# Patient Record
Sex: Female | Born: 1985 | Race: White | Hispanic: Yes | Marital: Single | Smoking: Never smoker
Health system: Southern US, Community
[De-identification: ages and names within clinical notes are randomized; demographics above are authoritative.]

## PROBLEM LIST (undated history)

## (undated) ENCOUNTER — Emergency Department (HOSPITAL_COMMUNITY): Admission: EM | Payer: Medicaid Other | Source: Home / Self Care

## (undated) ENCOUNTER — Inpatient Hospital Stay (HOSPITAL_COMMUNITY): Payer: Self-pay

---

## 2006-02-27 ENCOUNTER — Ambulatory Visit: Payer: Self-pay | Admitting: Internal Medicine

## 2006-03-03 ENCOUNTER — Ambulatory Visit: Payer: Self-pay | Admitting: Family Medicine

## 2006-06-25 ENCOUNTER — Inpatient Hospital Stay (HOSPITAL_COMMUNITY): Admission: AD | Admit: 2006-06-25 | Discharge: 2006-06-25 | Payer: Self-pay | Admitting: Family Medicine

## 2006-06-27 ENCOUNTER — Inpatient Hospital Stay (HOSPITAL_COMMUNITY): Admission: AD | Admit: 2006-06-27 | Discharge: 2006-06-27 | Payer: Self-pay | Admitting: Obstetrics and Gynecology

## 2006-07-04 ENCOUNTER — Inpatient Hospital Stay (HOSPITAL_COMMUNITY): Admission: AD | Admit: 2006-07-04 | Discharge: 2006-07-04 | Payer: Self-pay | Admitting: Family Medicine

## 2007-04-15 ENCOUNTER — Ambulatory Visit: Payer: Self-pay | Admitting: Family Medicine

## 2007-04-15 ENCOUNTER — Encounter: Payer: Self-pay | Admitting: Family Medicine

## 2007-04-15 LAB — CONVERTED CEMR LAB
Basophils Absolute: 0 10*3/uL (ref 0.0–0.1)
Eosinophils Absolute: 0 10*3/uL (ref 0.0–0.7)
Eosinophils Relative: 0 % (ref 0–5)
HCT: 38.7 % (ref 36.0–46.0)
Hepatitis B Surface Ag: NEGATIVE
Lymphs Abs: 1.5 10*3/uL (ref 0.7–3.3)
MCV: 91.1 fL (ref 78.0–100.0)
Monocytes Absolute: 0.6 10*3/uL (ref 0.2–0.7)
Platelets: 225 10*3/uL (ref 150–400)
RDW: 13.3 % (ref 11.5–14.0)
Rh Type: POSITIVE

## 2007-04-22 ENCOUNTER — Encounter: Payer: Self-pay | Admitting: Family Medicine

## 2007-04-22 ENCOUNTER — Ambulatory Visit: Payer: Self-pay | Admitting: Family Medicine

## 2007-04-22 LAB — CONVERTED CEMR LAB
Glucose, Urine, Semiquant: NEGATIVE
Protein, U semiquant: NEGATIVE

## 2007-04-23 ENCOUNTER — Encounter: Payer: Self-pay | Admitting: Family Medicine

## 2007-04-23 LAB — CONVERTED CEMR LAB: Chlamydia, DNA Probe: NEGATIVE

## 2007-04-28 ENCOUNTER — Encounter: Payer: Self-pay | Admitting: Family Medicine

## 2007-05-03 ENCOUNTER — Telehealth: Payer: Self-pay | Admitting: Family Medicine

## 2007-05-04 ENCOUNTER — Ambulatory Visit (HOSPITAL_COMMUNITY): Admission: RE | Admit: 2007-05-04 | Discharge: 2007-05-04 | Payer: Self-pay | Admitting: Sports Medicine

## 2007-05-05 ENCOUNTER — Encounter: Payer: Self-pay | Admitting: Family Medicine

## 2007-05-14 ENCOUNTER — Encounter: Payer: Self-pay | Admitting: Family Medicine

## 2007-05-14 ENCOUNTER — Encounter (INDEPENDENT_AMBULATORY_CARE_PROVIDER_SITE_OTHER): Payer: Self-pay | Admitting: Family Medicine

## 2007-05-14 ENCOUNTER — Ambulatory Visit: Payer: Self-pay | Admitting: Family Medicine

## 2007-05-14 ENCOUNTER — Telehealth: Payer: Self-pay | Admitting: *Deleted

## 2007-05-14 ENCOUNTER — Ambulatory Visit (HOSPITAL_COMMUNITY): Admission: RE | Admit: 2007-05-14 | Discharge: 2007-05-14 | Payer: Self-pay | Admitting: Family Medicine

## 2007-05-14 DIAGNOSIS — O4690 Antepartum hemorrhage, unspecified, unspecified trimester: Secondary | ICD-10-CM | POA: Insufficient documentation

## 2007-05-14 DIAGNOSIS — R319 Hematuria, unspecified: Secondary | ICD-10-CM | POA: Insufficient documentation

## 2007-05-14 LAB — CONVERTED CEMR LAB
Blood in Urine, dipstick: NEGATIVE
Ketones, urine, test strip: NEGATIVE
Nitrite: NEGATIVE
Protein, U semiquant: NEGATIVE
Urobilinogen, UA: 0.2
pH: 7

## 2007-05-18 ENCOUNTER — Ambulatory Visit: Payer: Self-pay | Admitting: Family Medicine

## 2007-05-18 ENCOUNTER — Encounter: Payer: Self-pay | Admitting: Family Medicine

## 2007-05-18 DIAGNOSIS — R1013 Epigastric pain: Secondary | ICD-10-CM

## 2007-05-18 LAB — CONVERTED CEMR LAB: Glucose, Urine, Semiquant: NEGATIVE

## 2007-05-27 ENCOUNTER — Encounter: Payer: Self-pay | Admitting: Family Medicine

## 2007-06-03 ENCOUNTER — Emergency Department (HOSPITAL_COMMUNITY): Admission: EM | Admit: 2007-06-03 | Discharge: 2007-06-04 | Payer: Self-pay | Admitting: Emergency Medicine

## 2007-06-04 ENCOUNTER — Telehealth: Payer: Self-pay | Admitting: *Deleted

## 2007-06-07 ENCOUNTER — Ambulatory Visit: Payer: Self-pay | Admitting: Family Medicine

## 2007-06-07 ENCOUNTER — Encounter (INDEPENDENT_AMBULATORY_CARE_PROVIDER_SITE_OTHER): Payer: Self-pay | Admitting: Family Medicine

## 2007-06-07 DIAGNOSIS — R300 Dysuria: Secondary | ICD-10-CM

## 2007-06-07 LAB — CONVERTED CEMR LAB
Albumin: 3.9 g/dL (ref 3.5–5.2)
BUN: 8 mg/dL (ref 6–23)
CO2: 23 meq/L (ref 19–32)
Calcium: 9.5 mg/dL (ref 8.4–10.5)
Chloride: 103 meq/L (ref 96–112)
Creatinine, Ser: 0.46 mg/dL (ref 0.40–1.20)
Glucose, Bld: 82 mg/dL (ref 70–99)
HCT: 35.7 % — ABNORMAL LOW (ref 36.0–46.0)
Hemoglobin: 11.7 g/dL — ABNORMAL LOW (ref 12.0–15.0)
Potassium: 4.9 meq/L (ref 3.5–5.3)
RBC: 3.93 M/uL (ref 3.87–5.11)
WBC: 8.4 10*3/uL (ref 4.0–10.5)

## 2007-06-15 ENCOUNTER — Encounter: Payer: Self-pay | Admitting: Family Medicine

## 2007-06-15 ENCOUNTER — Ambulatory Visit: Payer: Self-pay | Admitting: Family Medicine

## 2007-06-15 ENCOUNTER — Encounter: Payer: Self-pay | Admitting: *Deleted

## 2007-06-15 DIAGNOSIS — K8020 Calculus of gallbladder without cholecystitis without obstruction: Secondary | ICD-10-CM | POA: Insufficient documentation

## 2007-06-15 LAB — CONVERTED CEMR LAB: Protein, U semiquant: NEGATIVE

## 2007-06-17 ENCOUNTER — Telehealth: Payer: Self-pay | Admitting: *Deleted

## 2007-06-18 ENCOUNTER — Encounter (INDEPENDENT_AMBULATORY_CARE_PROVIDER_SITE_OTHER): Payer: Self-pay | Admitting: Family Medicine

## 2007-06-18 ENCOUNTER — Ambulatory Visit (HOSPITAL_COMMUNITY): Admission: RE | Admit: 2007-06-18 | Discharge: 2007-06-18 | Payer: Self-pay | Admitting: Sports Medicine

## 2007-07-05 ENCOUNTER — Encounter: Payer: Self-pay | Admitting: Family Medicine

## 2007-07-13 ENCOUNTER — Encounter: Payer: Self-pay | Admitting: Family Medicine

## 2007-07-14 ENCOUNTER — Ambulatory Visit: Payer: Self-pay | Admitting: Family Medicine

## 2007-07-14 LAB — CONVERTED CEMR LAB
Glucose, Urine, Semiquant: NEGATIVE
Protein, U semiquant: NEGATIVE

## 2007-08-18 ENCOUNTER — Encounter: Payer: Self-pay | Admitting: Family Medicine

## 2007-08-18 ENCOUNTER — Ambulatory Visit: Payer: Self-pay | Admitting: Family Medicine

## 2007-08-18 LAB — CONVERTED CEMR LAB: GTT, 1 hr: 133 mg/dL

## 2007-08-25 ENCOUNTER — Ambulatory Visit: Payer: Self-pay | Admitting: Family Medicine

## 2007-08-25 DIAGNOSIS — K5900 Constipation, unspecified: Secondary | ICD-10-CM | POA: Insufficient documentation

## 2007-09-14 ENCOUNTER — Encounter: Payer: Self-pay | Admitting: Family Medicine

## 2007-09-14 ENCOUNTER — Ambulatory Visit: Payer: Self-pay | Admitting: Family Medicine

## 2007-09-14 LAB — CONVERTED CEMR LAB

## 2007-10-01 ENCOUNTER — Ambulatory Visit: Payer: Self-pay | Admitting: Family Medicine

## 2007-10-14 ENCOUNTER — Ambulatory Visit: Payer: Self-pay | Admitting: Family Medicine

## 2007-10-14 ENCOUNTER — Encounter: Payer: Self-pay | Admitting: Family Medicine

## 2007-10-14 LAB — CONVERTED CEMR LAB
Chlamydia, DNA Probe: NEGATIVE
GC Probe Amp, Genital: NEGATIVE
Glucose, Urine, Semiquant: NEGATIVE
Protein, U semiquant: NEGATIVE

## 2007-10-25 ENCOUNTER — Encounter: Payer: Self-pay | Admitting: Family Medicine

## 2007-10-25 ENCOUNTER — Ambulatory Visit: Payer: Self-pay | Admitting: Family Medicine

## 2007-11-04 ENCOUNTER — Encounter: Payer: Self-pay | Admitting: Family Medicine

## 2007-11-04 ENCOUNTER — Ambulatory Visit: Payer: Self-pay | Admitting: Family Medicine

## 2007-11-04 LAB — CONVERTED CEMR LAB
Bilirubin Urine: NEGATIVE
Ketones, urine, test strip: NEGATIVE
Specific Gravity, Urine: 1.02
pH: 6.5

## 2007-11-05 ENCOUNTER — Inpatient Hospital Stay (HOSPITAL_COMMUNITY): Admission: AD | Admit: 2007-11-05 | Discharge: 2007-11-07 | Payer: Self-pay | Admitting: Gynecology

## 2007-11-05 ENCOUNTER — Ambulatory Visit: Payer: Self-pay | Admitting: Advanced Practice Midwife

## 2007-11-05 ENCOUNTER — Telehealth: Payer: Self-pay | Admitting: *Deleted

## 2007-11-09 ENCOUNTER — Encounter (INDEPENDENT_AMBULATORY_CARE_PROVIDER_SITE_OTHER): Payer: Self-pay | Admitting: *Deleted

## 2007-11-15 ENCOUNTER — Telehealth (INDEPENDENT_AMBULATORY_CARE_PROVIDER_SITE_OTHER): Payer: Self-pay | Admitting: *Deleted

## 2007-11-15 ENCOUNTER — Ambulatory Visit: Payer: Self-pay | Admitting: Sports Medicine

## 2007-11-15 DIAGNOSIS — IMO0002 Reserved for concepts with insufficient information to code with codable children: Secondary | ICD-10-CM

## 2007-11-15 LAB — CONVERTED CEMR LAB
Bilirubin Urine: NEGATIVE
Casts: 0 /lpf
Ketones, urine, test strip: NEGATIVE
Nitrite: NEGATIVE
Urobilinogen, UA: 0.2
WBC, UA: >20 cells/hpf

## 2007-12-09 ENCOUNTER — Ambulatory Visit: Payer: Self-pay | Admitting: Family Medicine

## 2007-12-09 DIAGNOSIS — N898 Other specified noninflammatory disorders of vagina: Secondary | ICD-10-CM | POA: Insufficient documentation

## 2007-12-18 ENCOUNTER — Emergency Department (HOSPITAL_COMMUNITY): Admission: EM | Admit: 2007-12-18 | Discharge: 2007-12-18 | Payer: Self-pay | Admitting: Emergency Medicine

## 2008-01-07 ENCOUNTER — Ambulatory Visit: Payer: Self-pay | Admitting: Family Medicine

## 2008-01-13 IMAGING — US US OB LIMITED
1 series · 14 of 17 positions shown · non-contrast
Comparison: none

OBSTETRICAL ULTRASOUND:

 This ultrasound exam was performed in the [HOSPITAL] Ultrasound Department.  The OB US report was generated in the AS system, and faxed to the ordering physician.  This report is also available in [REDACTED] PACS.

[Series 1: us ob limited · 14 of 17 slices shown]
[im 1/17]
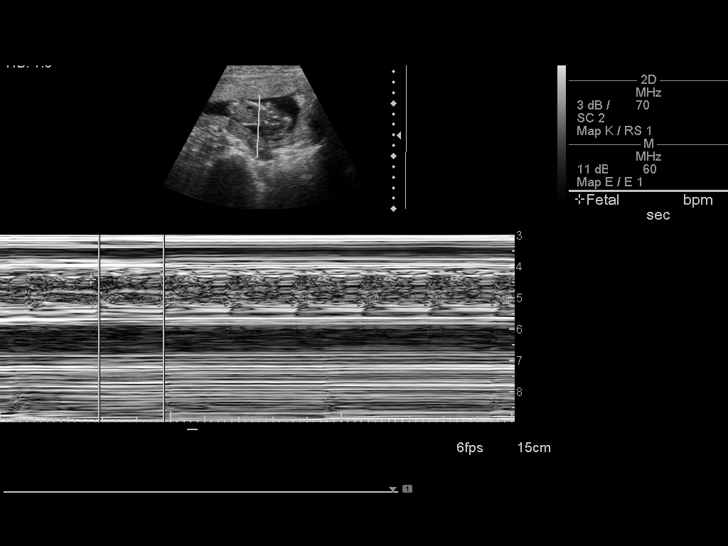
[im 2/17]
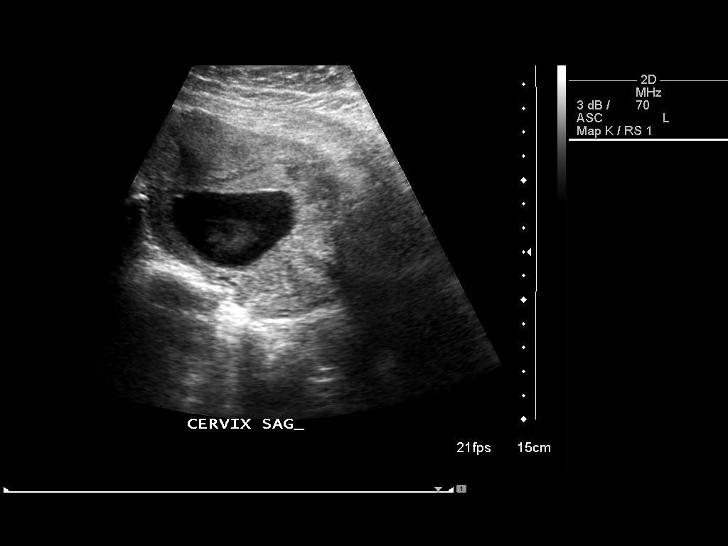
[im 4/17]
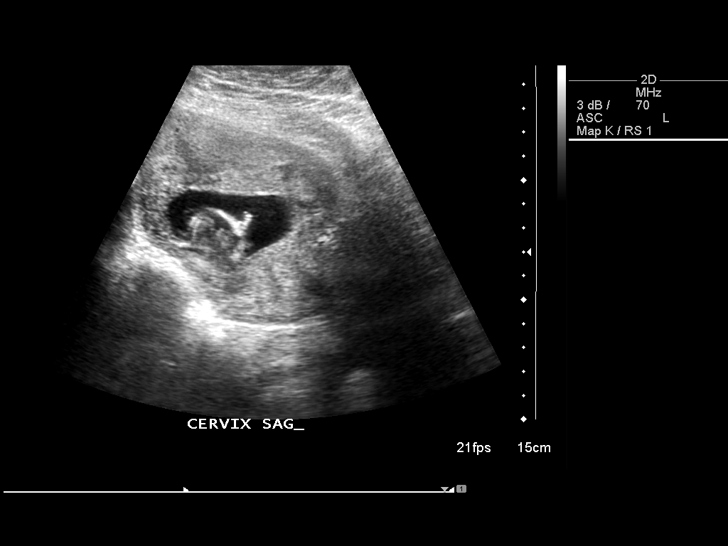
[im 5/17]
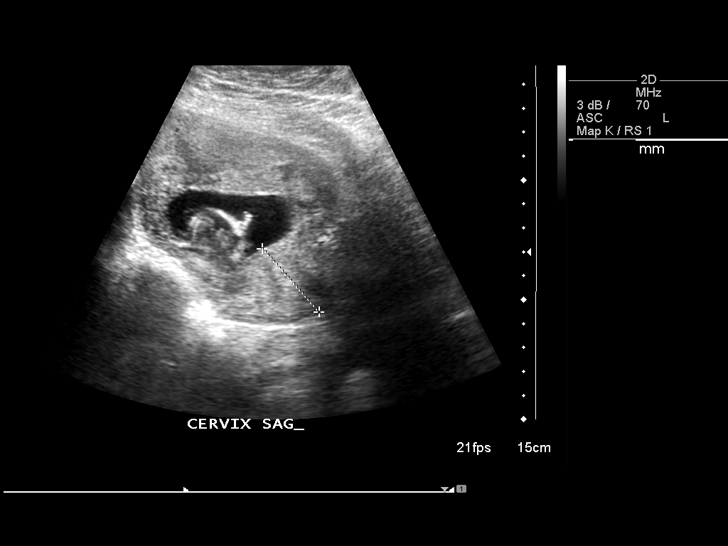
[im 6/17]
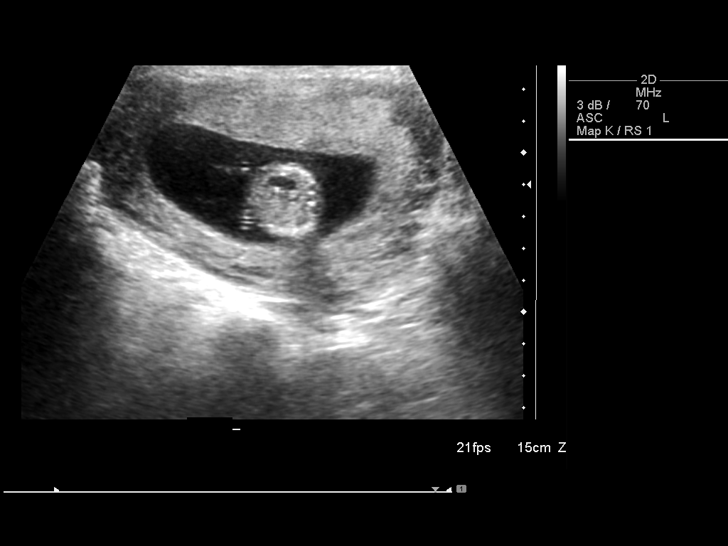
[im 7/17]
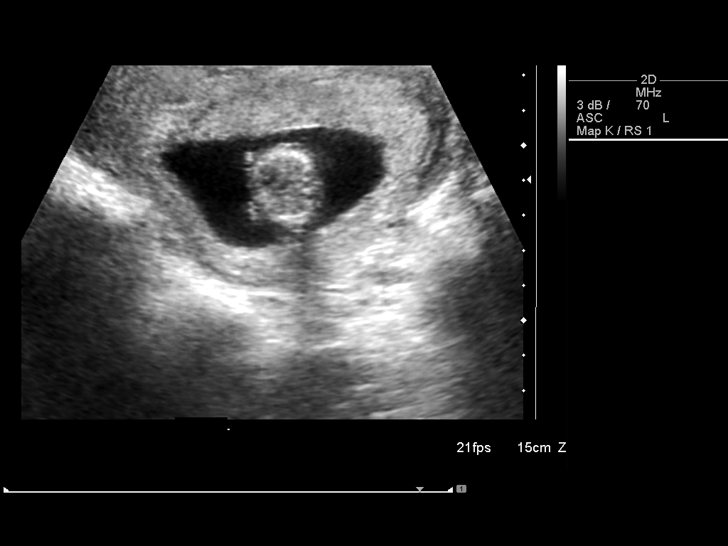
[im 8/17]
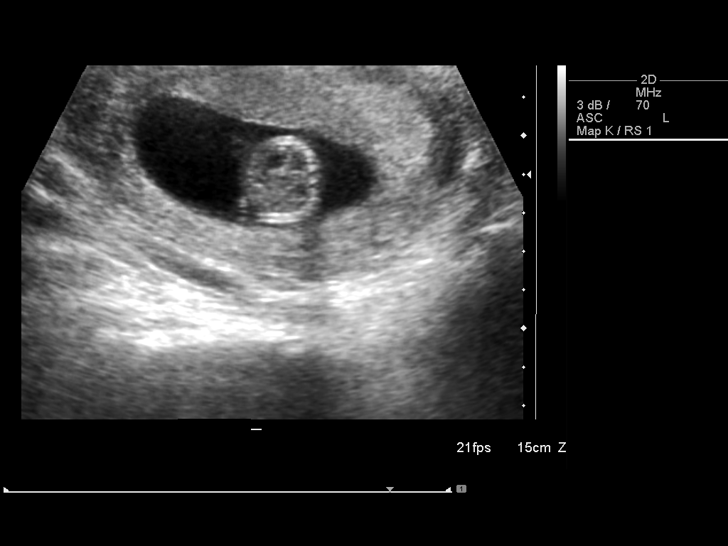
[im 10/17]
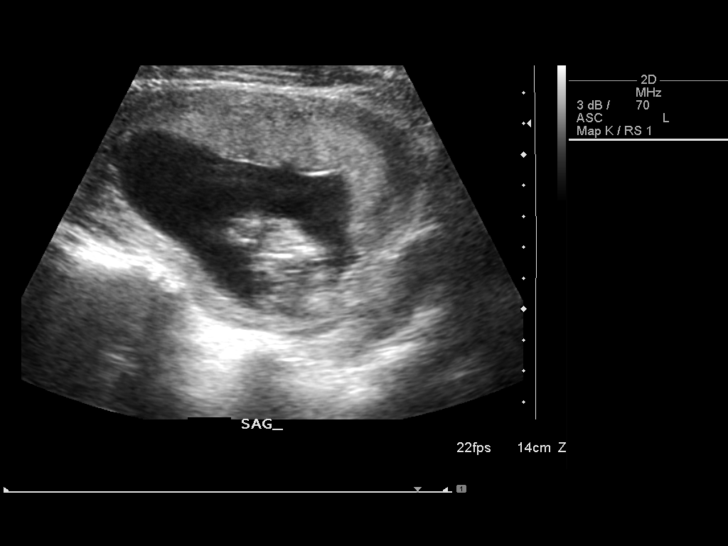
[im 11/17]
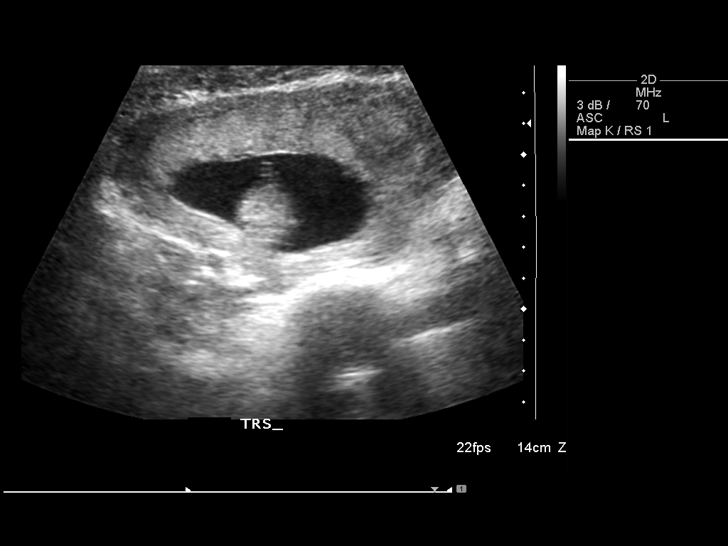
[im 12/17]
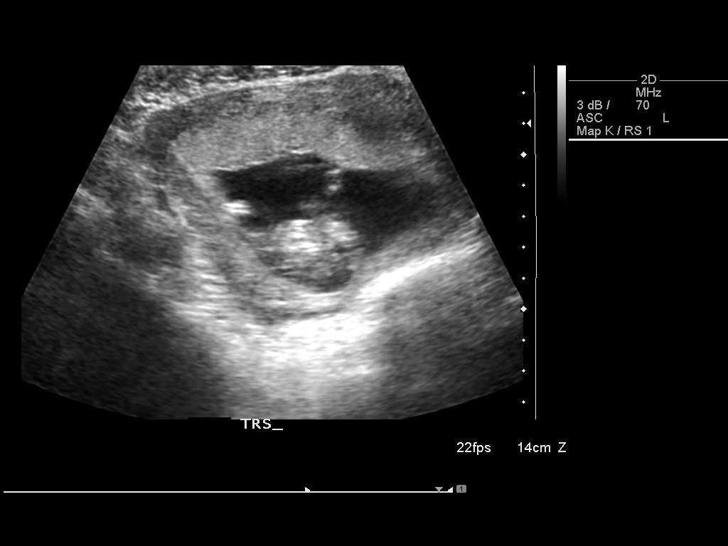
[im 13/17]
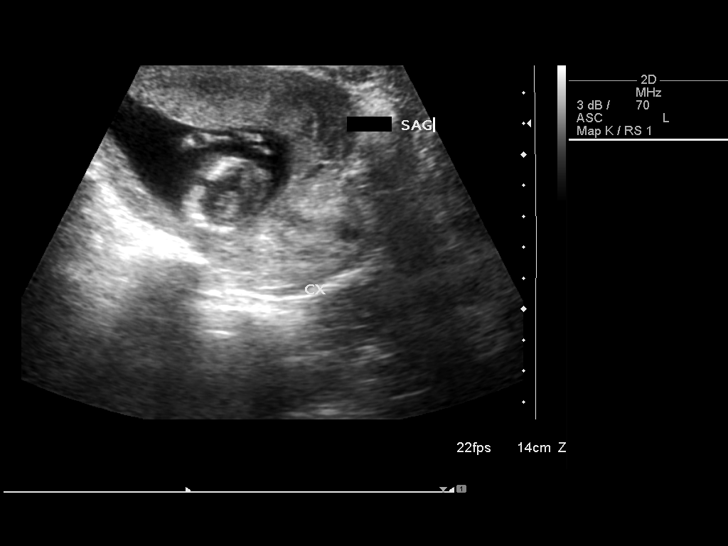
[im 14/17]
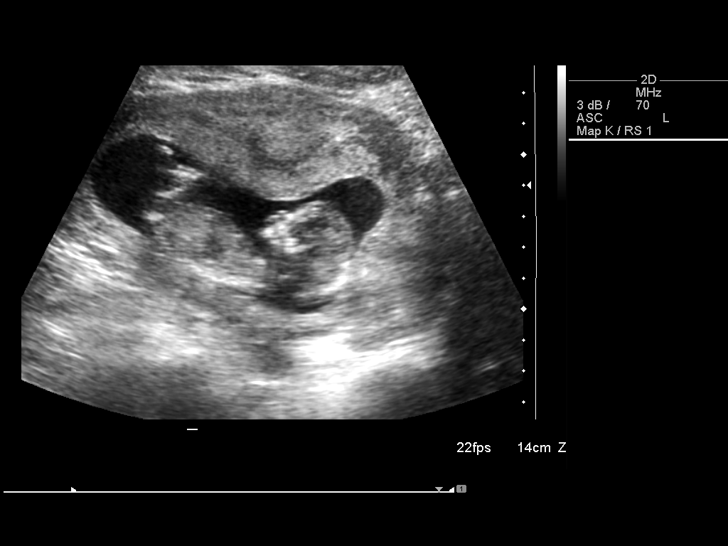
[im 16/17]
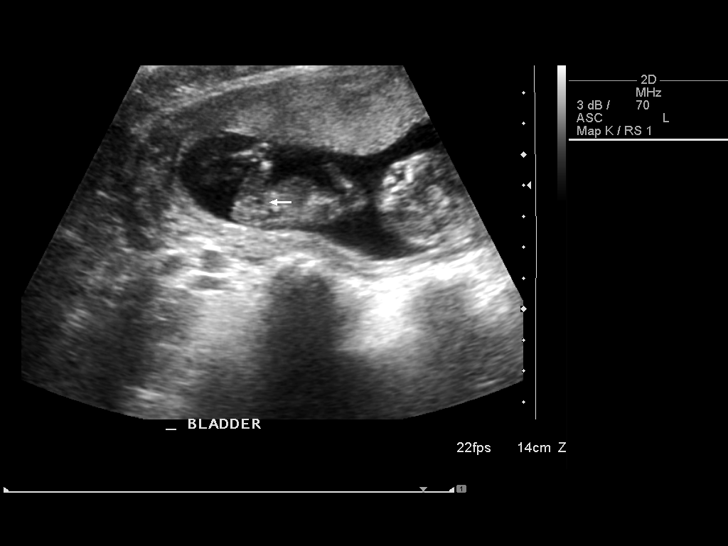
[im 17/17]
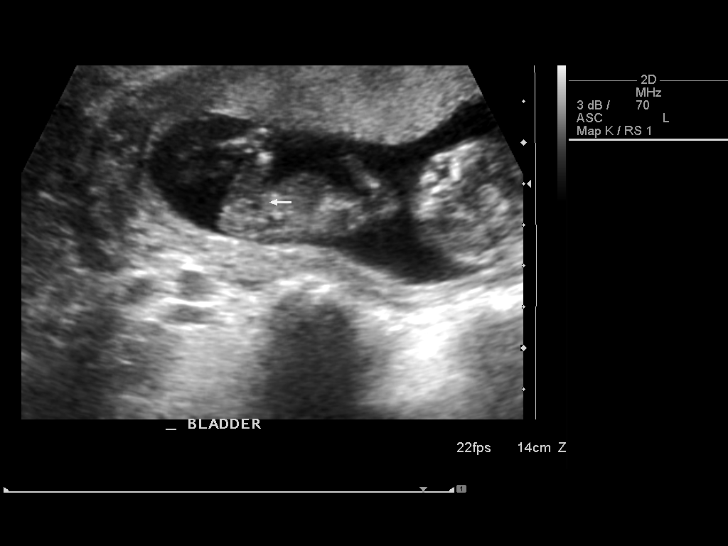

[14 of 17 positions shown; findings below may reference images not displayed]

IMPRESSION: See AS Obstetric US report.

## 2008-01-31 ENCOUNTER — Ambulatory Visit (HOSPITAL_COMMUNITY): Admission: RE | Admit: 2008-01-31 | Discharge: 2008-01-31 | Payer: Self-pay | Admitting: General Surgery

## 2008-01-31 ENCOUNTER — Telehealth (INDEPENDENT_AMBULATORY_CARE_PROVIDER_SITE_OTHER): Payer: Self-pay | Admitting: *Deleted

## 2008-01-31 ENCOUNTER — Encounter (INDEPENDENT_AMBULATORY_CARE_PROVIDER_SITE_OTHER): Payer: Self-pay | Admitting: General Surgery

## 2008-01-31 HISTORY — PX: CHOLECYSTECTOMY, LAPAROSCOPIC: SHX56

## 2008-02-02 IMAGING — US US ABDOMEN COMPLETE
1 series · 14 of 25 positions shown · non-contrast
Comparison: none

CLINICAL DATA: Pregnant patient with abdominal pain. Assess for gallbladder disease.
 ABDOMEN ULTRASOUND:
TECHNIQUE: Complete abdominal ultrasound examination was performed including evaluation of the liver, gallbladder, bile ducts, pancreas, kidneys, spleen, IVC, and abdominal aorta.

[Series 1: unknown · 0.30mm/px · 14 of 76 slices shown]
[im 1/76]
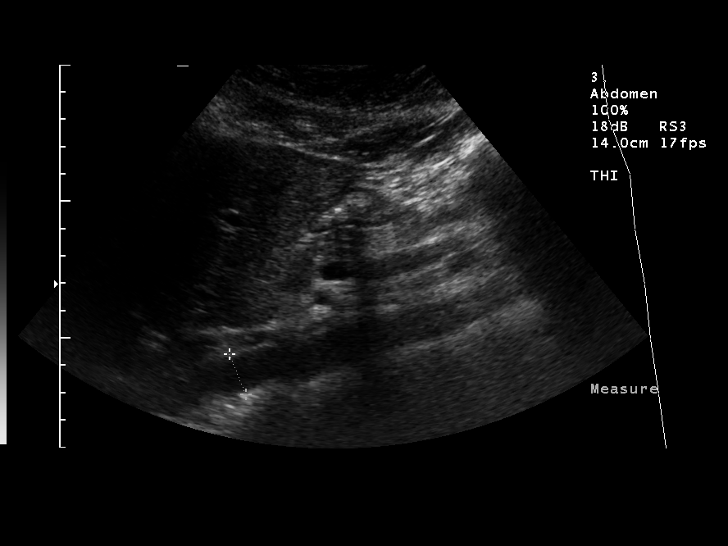
[im 7/76]
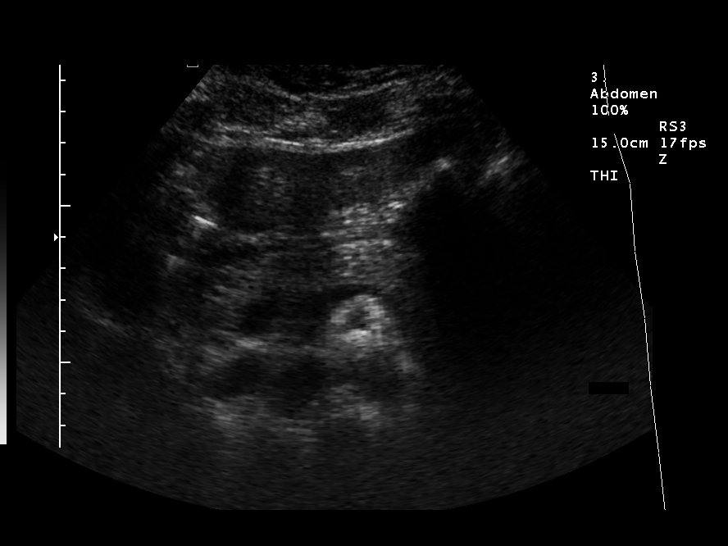
[im 13/76]
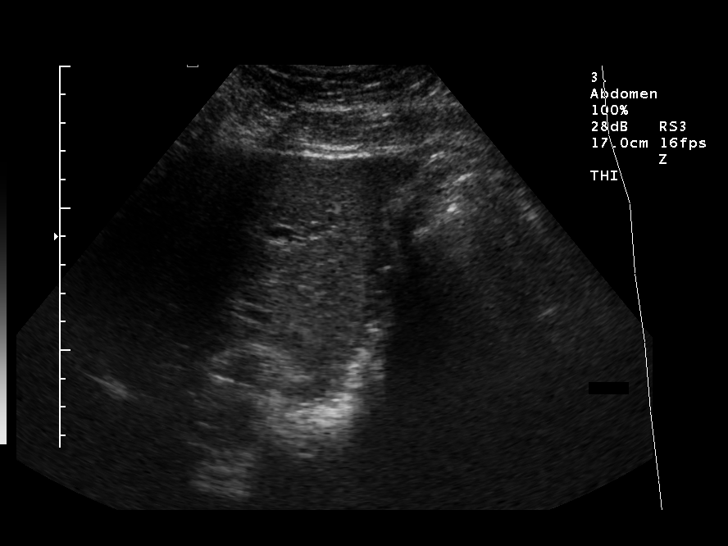
[im 19/76]
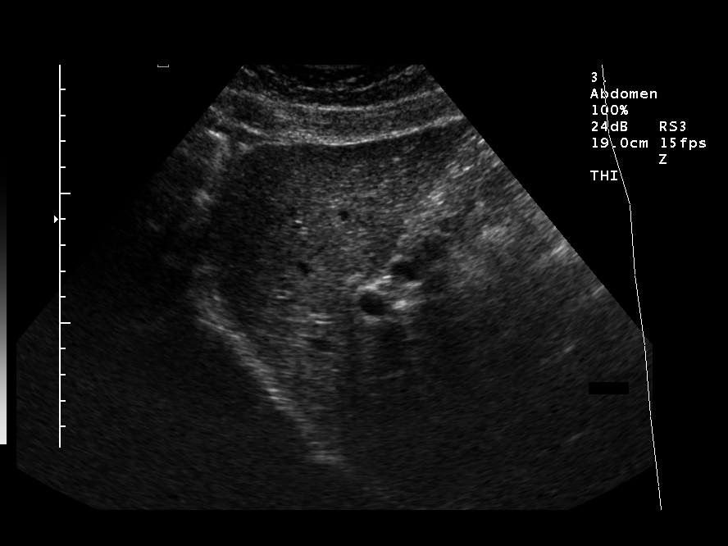
[im 26/76]
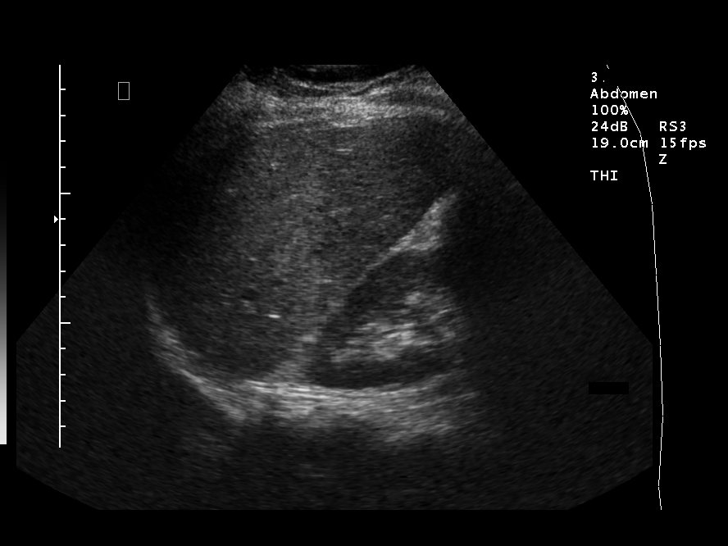
[im 29/76]
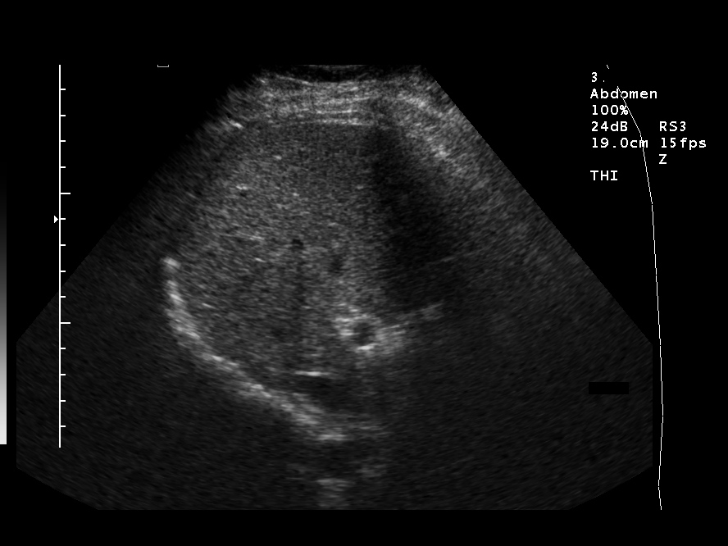
[im 35/76]
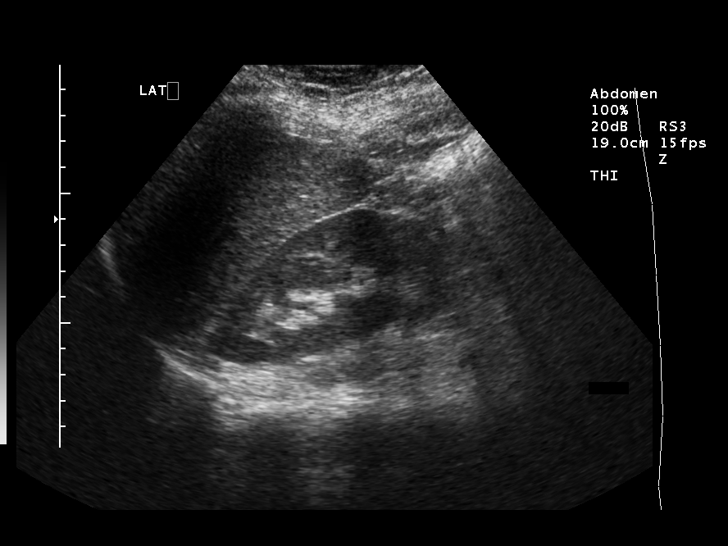
[im 41/76]
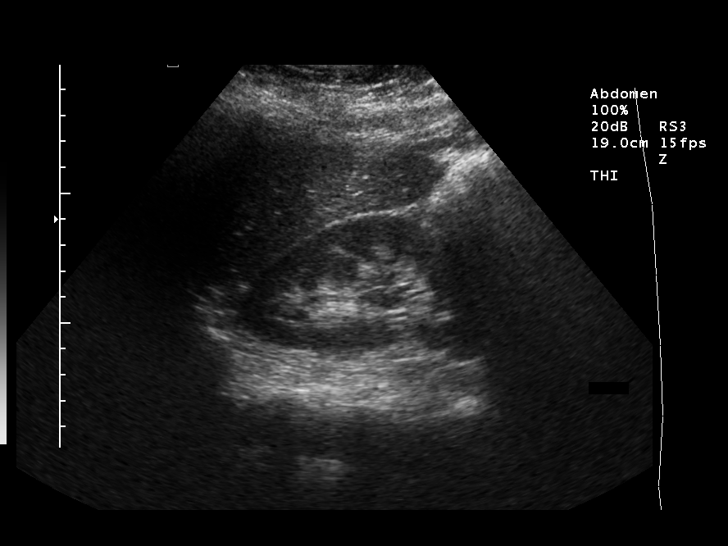
[im 47/76]
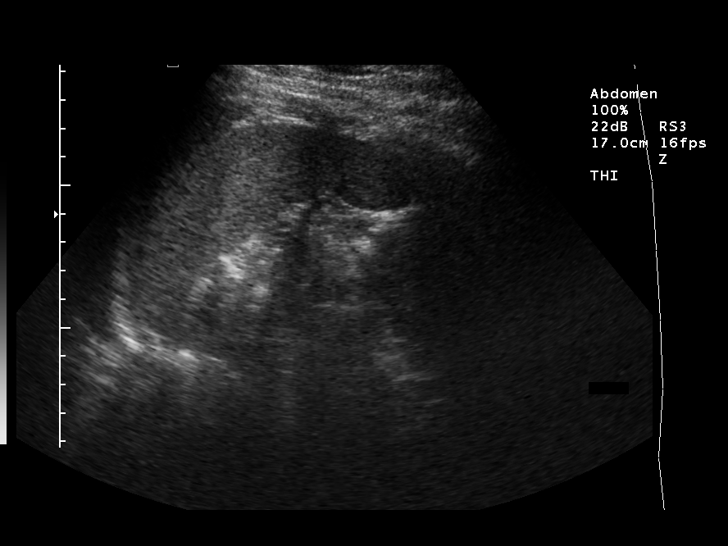
[im 51/76]
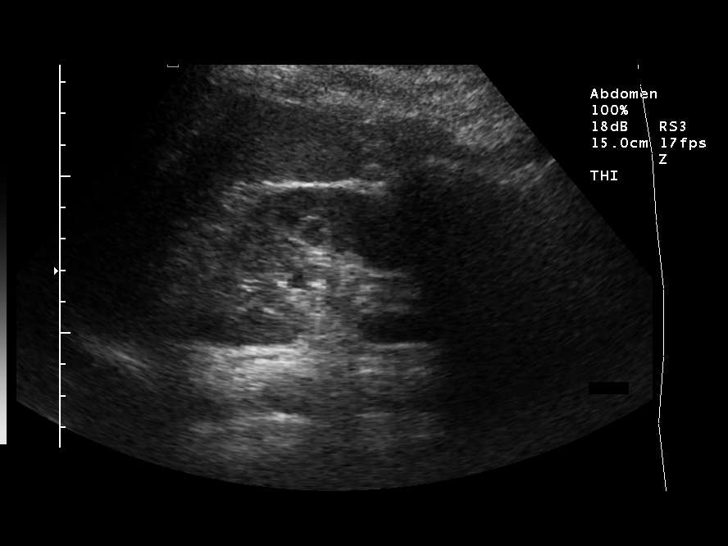
[im 57/76]
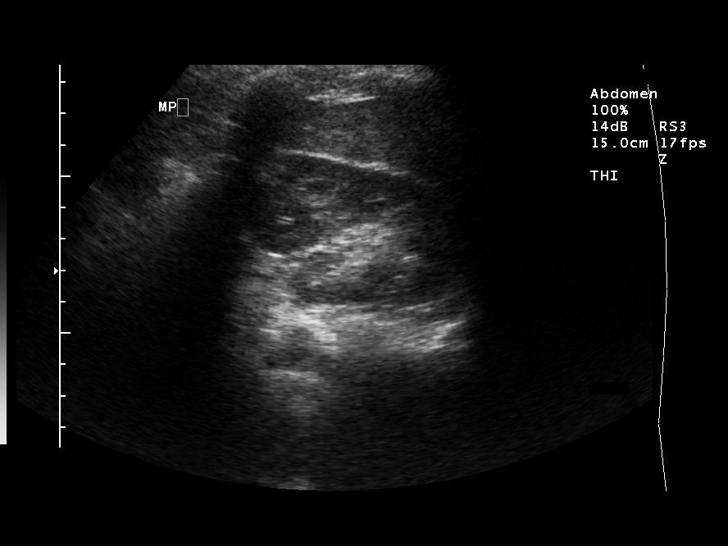
[im 63/76]
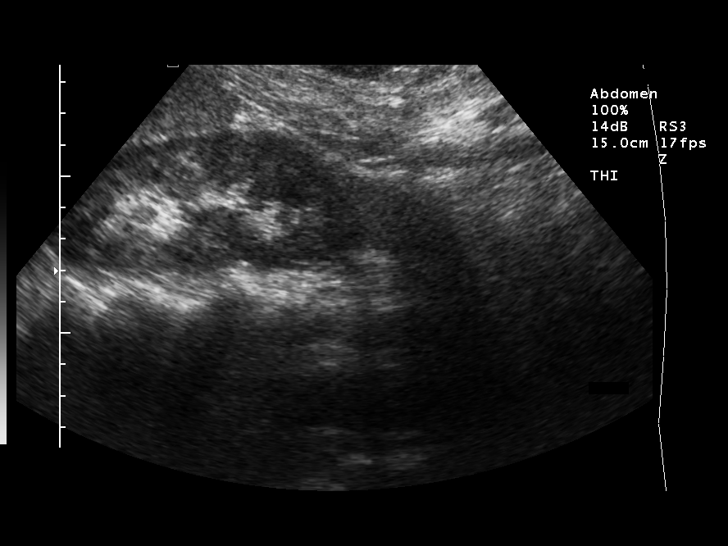
[im 69/76]
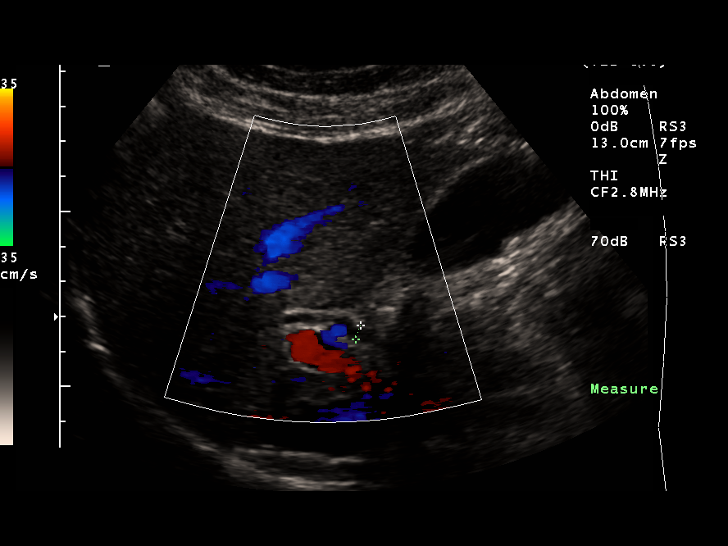
[im 76/76]
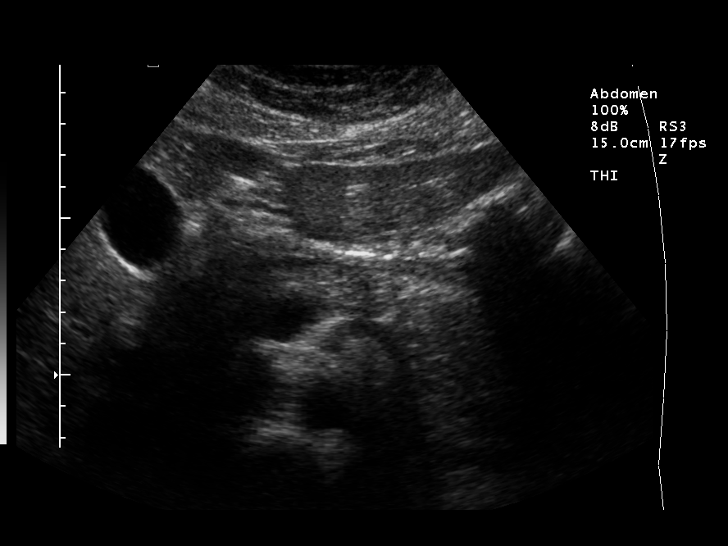

[14 of 25 positions shown; findings below may reference images not displayed]

FINDINGS: The liver has a normal appearance without focal lesions or biliary ductal dilatation. The common duct is normal at 4 mm. The patient does have a few small gallstones that move freely in the gallbladder. No wall thickening or pericholecystic fluid.  No sonographic Murphy?s sign. The spleen and pancreas are normal. Both kidneys are normal at 11.2 cm.  The aorta and IVC are normal. No free intraperitoneal fluid.  A living intrauterine gestation with heart rate of 138 beats per minute. Not evaluated beyond that.
IMPRESSION: Gallstones without sonographic evidence of cholecystitis or obstruction.

## 2008-02-17 IMAGING — US US OB COMP +14 WK
1 series · 14 of 28 positions shown · non-contrast
Comparison: none

OBSTETRICAL ULTRASOUND:

 This ultrasound exam was performed in the [HOSPITAL] Ultrasound Department.  The OB US report was generated in the AS system, and faxed to the ordering physician.  This report is also available in [REDACTED] PACS.

[Series 1: us ob comp +14 wk · 14 of 92 slices shown]
[im 4/92]
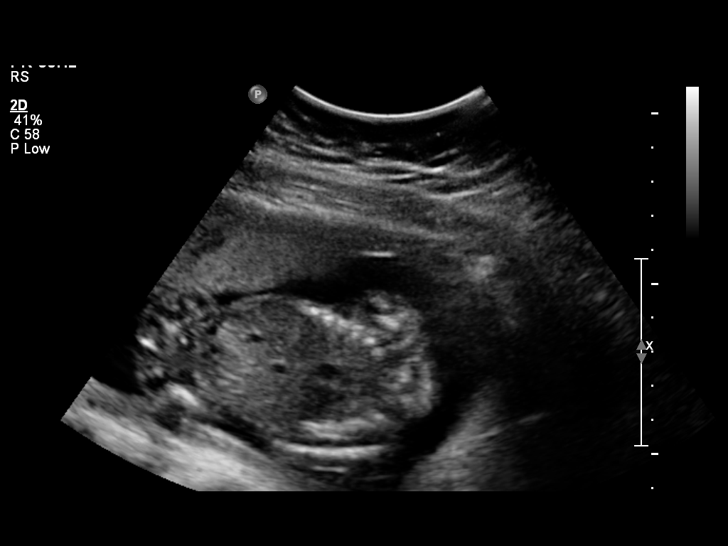
[im 11/92]
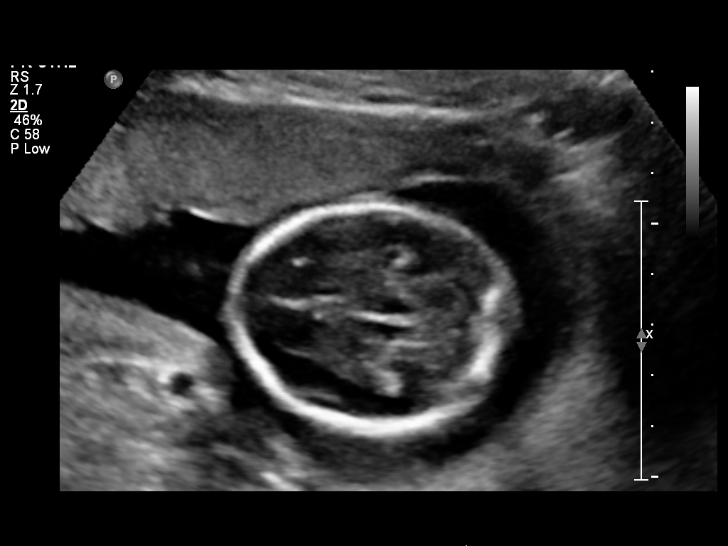
[im 17/92]
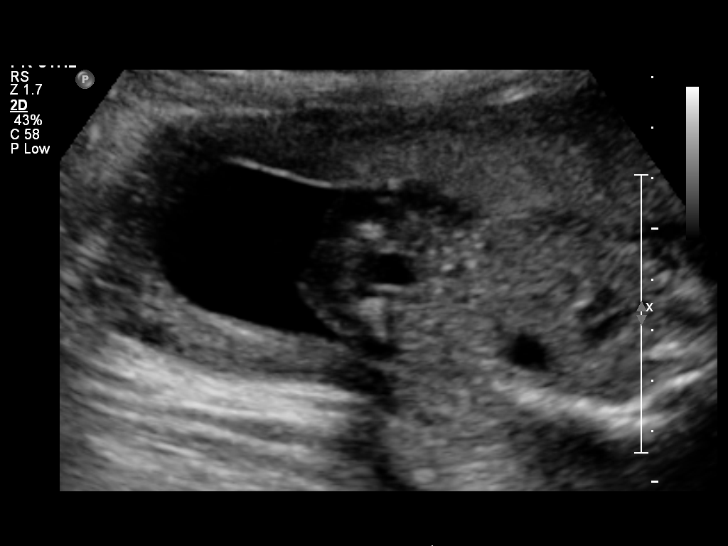
[im 24/92]
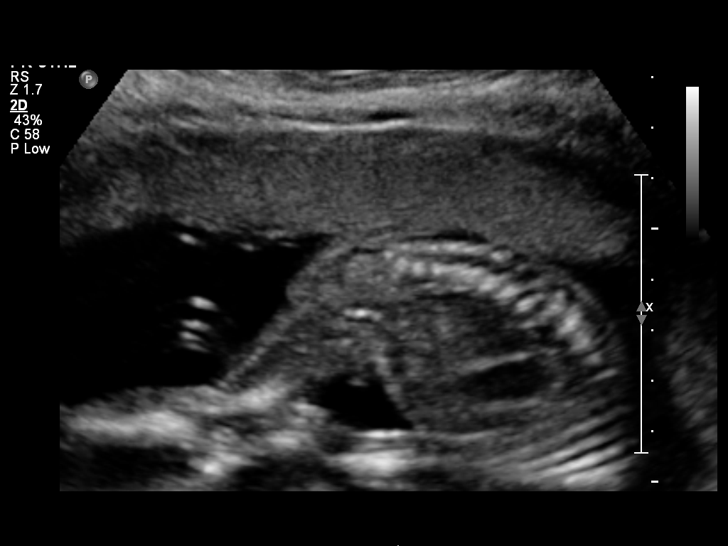
[im 31/92]
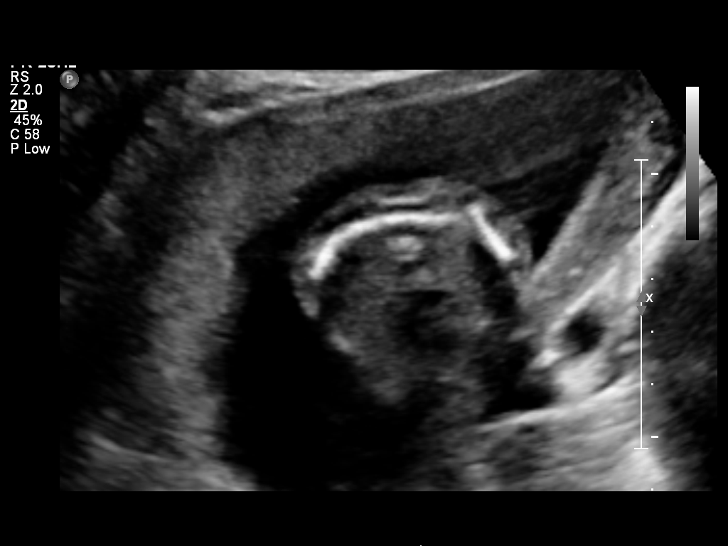
[im 38/92]
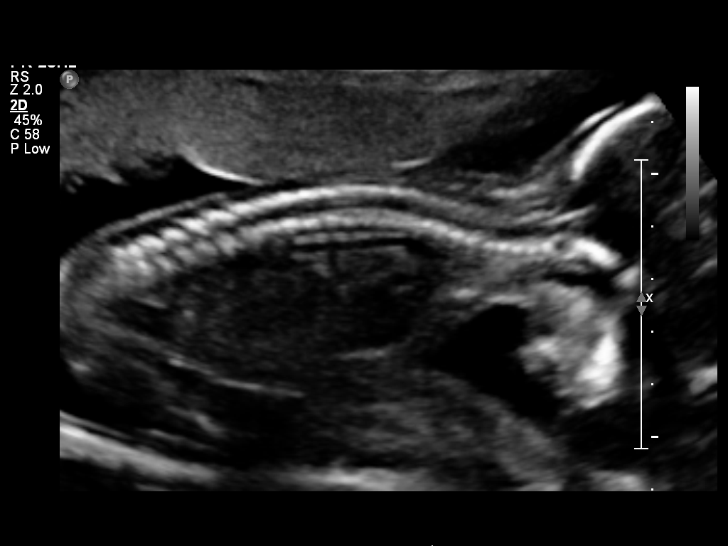
[im 44/92]
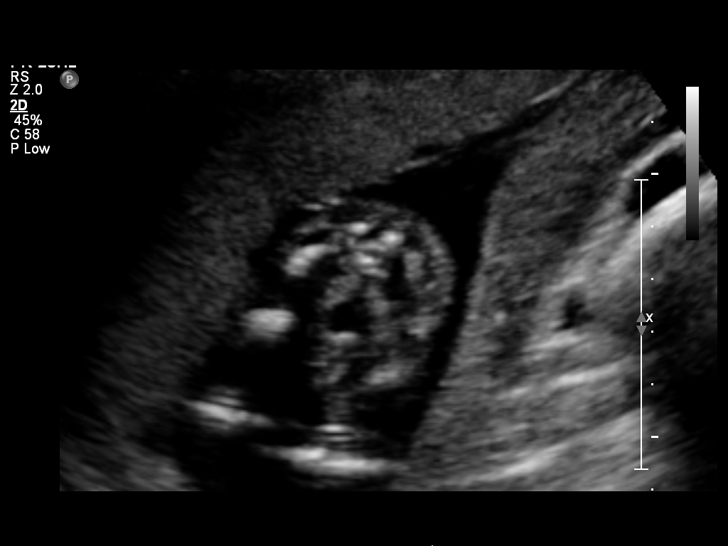
[im 51/92]
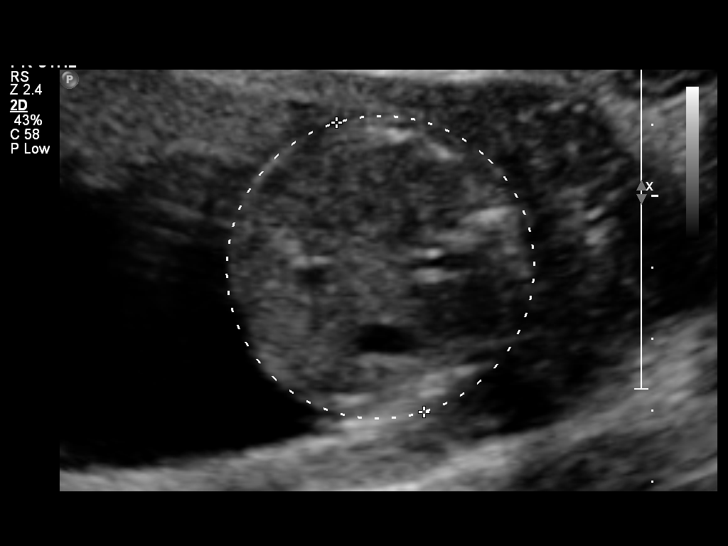
[im 58/92]
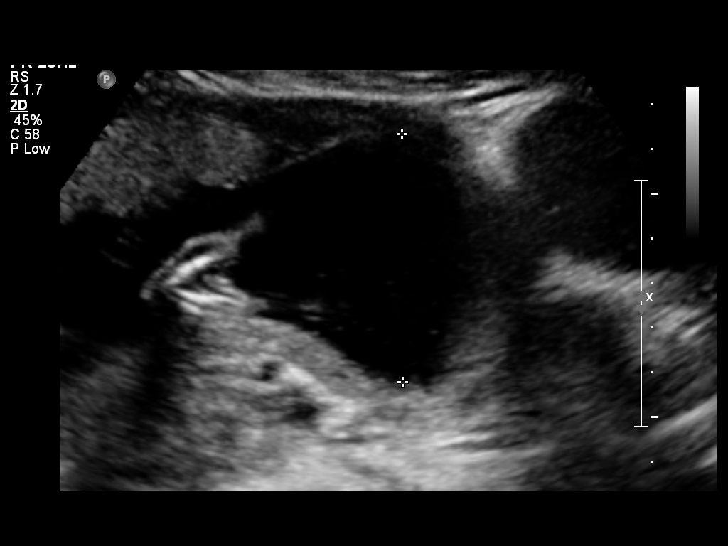
[im 65/92]
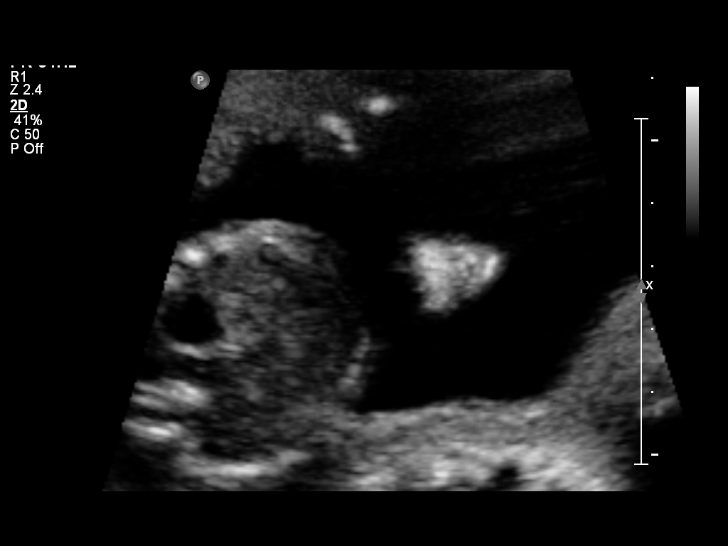
[im 71/92]
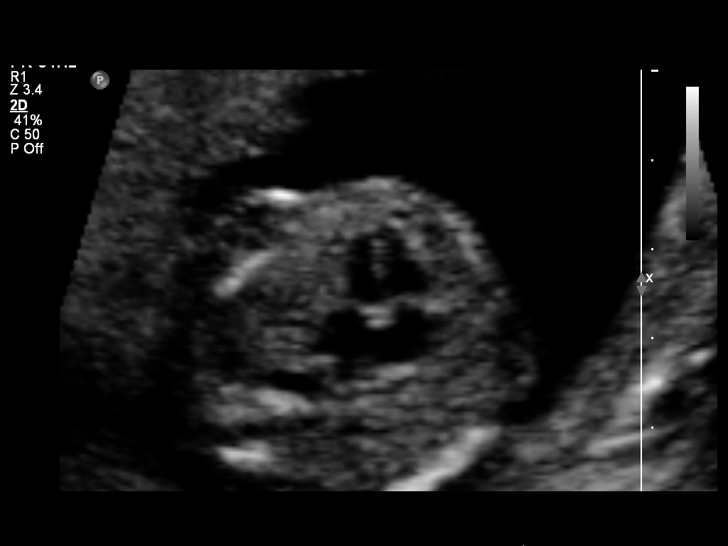
[im 78/92]
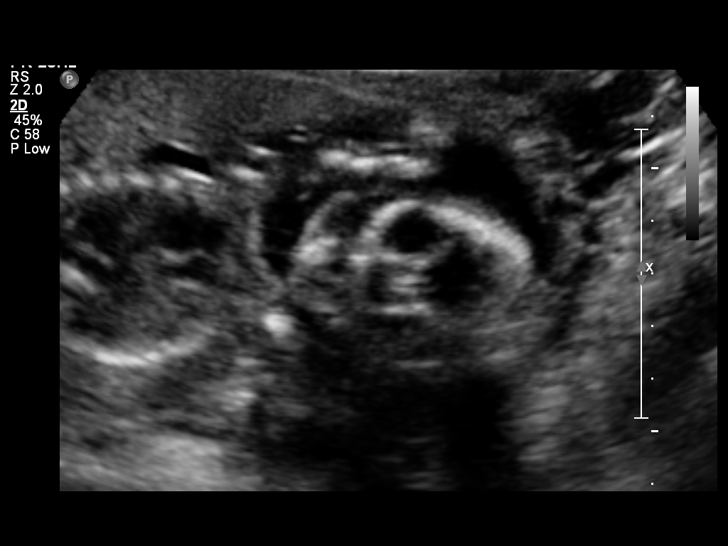
[im 85/92]
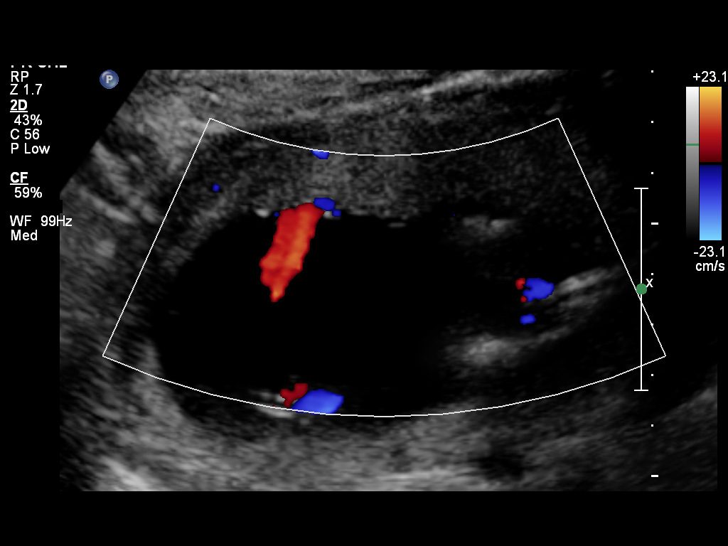
[im 92/92]
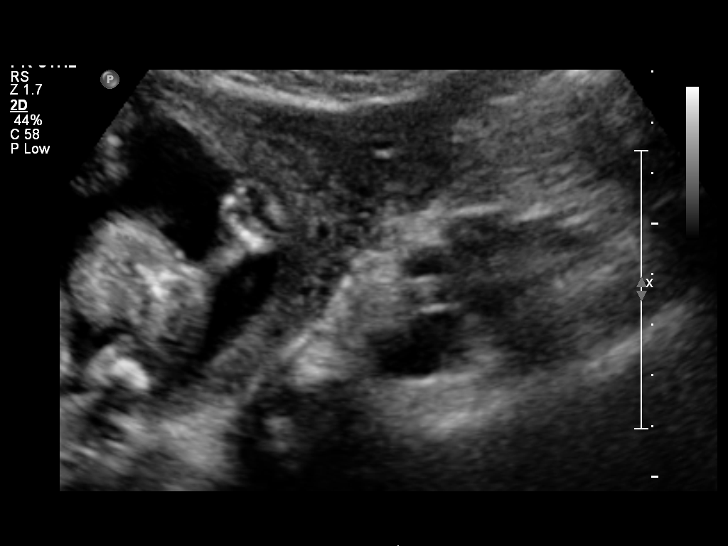

[14 of 28 positions shown; findings below may reference images not displayed]

IMPRESSION: See AS Obstetric US report.

## 2008-02-18 ENCOUNTER — Ambulatory Visit: Payer: Self-pay | Admitting: Family Medicine

## 2008-02-25 ENCOUNTER — Encounter: Payer: Self-pay | Admitting: Family Medicine

## 2008-07-04 ENCOUNTER — Emergency Department (HOSPITAL_COMMUNITY): Admission: EM | Admit: 2008-07-04 | Discharge: 2008-07-04 | Payer: Self-pay | Admitting: Emergency Medicine

## 2008-07-04 ENCOUNTER — Telehealth: Payer: Self-pay | Admitting: Family Medicine

## 2008-07-04 ENCOUNTER — Ambulatory Visit: Payer: Self-pay | Admitting: Psychiatry

## 2008-07-04 ENCOUNTER — Encounter: Payer: Self-pay | Admitting: Family Medicine

## 2008-07-04 ENCOUNTER — Inpatient Hospital Stay (HOSPITAL_COMMUNITY): Admission: RE | Admit: 2008-07-04 | Discharge: 2008-07-05 | Payer: Self-pay | Admitting: Psychiatry

## 2008-07-06 ENCOUNTER — Encounter: Payer: Self-pay | Admitting: *Deleted

## 2008-07-10 ENCOUNTER — Ambulatory Visit: Payer: Self-pay | Admitting: Family Medicine

## 2008-07-10 ENCOUNTER — Emergency Department (HOSPITAL_COMMUNITY): Admission: EM | Admit: 2008-07-10 | Discharge: 2008-07-10 | Payer: Self-pay | Admitting: Emergency Medicine

## 2010-10-14 ENCOUNTER — Encounter: Payer: Self-pay | Admitting: Sports Medicine

## 2010-11-15 ENCOUNTER — Encounter: Payer: Self-pay | Admitting: *Deleted

## 2010-11-24 ENCOUNTER — Emergency Department (HOSPITAL_COMMUNITY): Payer: Self-pay

## 2010-11-24 ENCOUNTER — Emergency Department (HOSPITAL_COMMUNITY)
Admission: EM | Admit: 2010-11-24 | Discharge: 2010-11-24 | Disposition: A | Discharge status: Home / Self Care | Payer: Self-pay | Attending: Emergency Medicine | Admitting: Emergency Medicine

## 2010-11-24 DIAGNOSIS — S99929A Unspecified injury of unspecified foot, initial encounter: Secondary | ICD-10-CM | POA: Insufficient documentation

## 2010-11-24 DIAGNOSIS — M25569 Pain in unspecified knee: Secondary | ICD-10-CM | POA: Insufficient documentation

## 2010-11-24 DIAGNOSIS — S8000XA Contusion of unspecified knee, initial encounter: Secondary | ICD-10-CM | POA: Insufficient documentation

## 2010-11-24 DIAGNOSIS — R059 Cough, unspecified: Secondary | ICD-10-CM | POA: Insufficient documentation

## 2010-11-24 DIAGNOSIS — S8990XA Unspecified injury of unspecified lower leg, initial encounter: Secondary | ICD-10-CM | POA: Insufficient documentation

## 2010-11-24 DIAGNOSIS — R05 Cough: Secondary | ICD-10-CM | POA: Insufficient documentation

## 2010-11-24 DIAGNOSIS — Y92009 Unspecified place in unspecified non-institutional (private) residence as the place of occurrence of the external cause: Secondary | ICD-10-CM | POA: Insufficient documentation

## 2010-11-24 DIAGNOSIS — W06XXXA Fall from bed, initial encounter: Secondary | ICD-10-CM | POA: Insufficient documentation

## 2010-11-24 DIAGNOSIS — E669 Obesity, unspecified: Secondary | ICD-10-CM | POA: Insufficient documentation

## 2011-02-04 NOTE — Op Note (Signed)
NAME:  Katherine Paul, Katherine Paul       ACCOUNT NO.:  [id number]   MEDICAL RECORD NO.:  [id number]          PATIENT TYPE:  AMB   LOCATION:  DAY                          FACILITY:  [facility]   PHYSICIAN:  Lennie Muckle, MD      DATE OF BIRTH:  [birth year]   DATE OF PROCEDURE:  01/31/2008  DATE OF DISCHARGE:                               OPERATIVE REPORT   PREOPERATIVE DIAGNOSIS:  Symptomatic cholelithiasis.   POSTOPERATIVE DIAGNOSIS:  Symptomatic cholelithiasis.   PROCEDURE:  Laparoscopic cholecystectomy.   SURGEON:  Bertram Savin, M.D.   ASSISTANT:  No assistants.   ANESTHESIA:  General endotracheal anesthesia.   SPECIMEN:  Gallbladder.   ESTIMATED BLOOD LOSS:  Minimal amount of blood loss.   COMPLICATIONS:  No immediate complications.   DRAINS:  No drains were placed.   INDICATIONS FOR PROCEDURE:  Katherine Paul is a 25 year old female I  had seen earlier in April due to multiple episodes of epigastric right  upper quadrant pain.  She was found to have gallstones on an ultrasound.  Thus diagnosis was consistent with symptomatic cholelithiasis.  Informed  consent was obtained for laparoscopic possible open cholecystectomy.   DETAILS OF PROCEDURE:  Katherine Paul was identified in the  preoperative holding area, taken to the operating room, placed in supine  position.  After administration of general endotracheal anesthesia, her  abdomen was prepped and draped in the usual sterile fashion.  A time-out  procedure indicating the patient and procedure were performed.  An  incision was placed at the umbilicus.  Veress needle introduced in the  abdominal cavity.  After adequate pneumoinsufflation, a #11 mm trocar  was placed through the umbilical incision using the OptiVu.  There was  no evidence of injury upon placement of the trocar or the Veress needle.  Three additional 5-mm trocars were placed with the guidance of the  camera.  One was placed at the epigastric region, two were  placed in the  right side of the abdomen.  The gallbladder was visualized within the  gallbladder fossa.  The infundibulum was grasped.  There were omental  adhesions which were easily taken down with blunt dissection.  The  fundus was retracted toward the head of the patient and the infundibulum  was grasped away from the liver bed.  The gallbladder was rather long.  I carefully dissected the peritoneum down near the infundibulum of the  gallbladder.  I was able to visualize the cystic duct and the cystic  artery.  Carefully dissecting the peritoneal attachments at the cystic  duct, I was able to obtain a critical view of the cystic duct artery in  the liver bed.  There were two clips placed proximally, one distally and  the cystic  ductwas transected.  The cystic artery was also clipped and  divided.  Minimal amount of bleeding during the dissection.  There was a  posterior branch which was also clipped.  The remaining peritoneal  attachments of the gallbladder were taken down with electrocautery.  The  gallbladder was placed in the EndoCatch bag and removed from the  abdomen.  The abdomen was irrigated with almost  a liter of saline.  Inspection of the liver bed revealed only a minimal amount of oozing  which was easily controlled with electrocautery.  Aspiration of the  fluid revealed no bilious contents and the aspirate was clear.  A final  inspection of the gallbladder fossa revealed no bleeding and the area  was dried.  I closed the umbilical incision with 0 Vicryl suture while  inspecting the closure with the camera.  Pneumoperitoneum was released.  The skin was closed with 4-0 Monocryl.  Steri-Strips were placed for  final dressing.  The patient was extubated, transported to the  postanesthesia care unit in stable condition.  She will be discharged  home if she is tolerating liquids and pain is controlled.  She will  follow up with me in approximately 2 or 3 weeks' time.       Lennie Muckle, MD  Electronically Signed     ALA/MEDQ  D:  01/31/2008  T:  01/31/2008  Job:  [id number]   cc:   [place]  Fax: [phone]

## 2011-06-13 LAB — RPR: RPR Ser Ql: NONREACTIVE

## 2011-06-16 LAB — DIFFERENTIAL
Basophils Relative: 0
Eosinophils Absolute: 0
Eosinophils Relative: 1
Lymphs Abs: 3.2
Monocytes Absolute: 0.8
Monocytes Relative: 8
Neutrophils Relative %: 57

## 2011-06-16 LAB — URINE MICROSCOPIC-ADD ON

## 2011-06-16 LAB — CBC
HCT: 36.2
Hemoglobin: 12.1
MCHC: 33.3
MCV: 89.4
RBC: 4.05
WBC: 9.5

## 2011-06-16 LAB — URINALYSIS, ROUTINE W REFLEX MICROSCOPIC
Glucose, UA: NEGATIVE
Ketones, ur: NEGATIVE
Nitrite: NEGATIVE
Specific Gravity, Urine: 1.025
pH: 5.5

## 2011-06-16 LAB — POCT I-STAT, CHEM 8
Calcium, Ion: 1.12
Creatinine, Ser: 1
Glucose, Bld: 95
HCT: 36
Hemoglobin: 12.2

## 2011-06-16 LAB — HEPATIC FUNCTION PANEL
ALT: 16
AST: 16
Albumin: 3.7
Bilirubin, Direct: 0.2

## 2011-06-16 LAB — URINE CULTURE

## 2011-06-16 LAB — PREGNANCY, URINE: Preg Test, Ur: NEGATIVE

## 2011-06-23 LAB — CBC
MCHC: 33.6
MCV: 89.4
Platelets: 216
RBC: 4.37
WBC: 7.2

## 2011-06-23 LAB — DIFFERENTIAL
Basophils Relative: 1
Eosinophils Absolute: 0
Neutro Abs: 4.6
Neutrophils Relative %: 64

## 2011-06-23 LAB — POCT I-STAT, CHEM 8
Chloride: 108
HCT: 40
Hemoglobin: 13.6
Potassium: 4.2
Sodium: 139

## 2011-06-23 LAB — URINALYSIS, ROUTINE W REFLEX MICROSCOPIC
Bilirubin Urine: NEGATIVE
Hgb urine dipstick: NEGATIVE
Ketones, ur: NEGATIVE
Nitrite: NEGATIVE
Specific Gravity, Urine: 1.023
Urobilinogen, UA: 0.2

## 2011-06-23 LAB — RAPID URINE DRUG SCREEN, HOSP PERFORMED
Amphetamines: NOT DETECTED
Barbiturates: NOT DETECTED
Benzodiazepines: NOT DETECTED
Opiates: NOT DETECTED
Tetrahydrocannabinol: NOT DETECTED

## 2011-07-04 LAB — COMPREHENSIVE METABOLIC PANEL
AST: 38 — ABNORMAL HIGH
CO2: 23
Chloride: 106
Glucose, Bld: 100 — ABNORMAL HIGH
Potassium: 3.5
Sodium: 134 — ABNORMAL LOW
Total Protein: 6.4

## 2011-07-04 LAB — DIFFERENTIAL
Eosinophils Relative: 0
Lymphocytes Relative: 26
Lymphs Abs: 2.9
Monocytes Absolute: 1 — ABNORMAL HIGH
Neutro Abs: 7.2

## 2011-07-04 LAB — URINALYSIS, ROUTINE W REFLEX MICROSCOPIC
Bilirubin Urine: NEGATIVE
Nitrite: NEGATIVE
Specific Gravity, Urine: 1.021
Urobilinogen, UA: 1
pH: 7

## 2011-07-04 LAB — CBC
HCT: 33.6 — ABNORMAL LOW
Hemoglobin: 11.5 — ABNORMAL LOW
RBC: 3.77 — ABNORMAL LOW
WBC: 11.2 — ABNORMAL HIGH

## 2011-07-04 LAB — URINE MICROSCOPIC-ADD ON

## 2011-07-04 LAB — LIPASE, BLOOD: Lipase: 20

## 2011-10-06 ENCOUNTER — Ambulatory Visit (INDEPENDENT_AMBULATORY_CARE_PROVIDER_SITE_OTHER): Payer: Self-pay | Admitting: Family Medicine

## 2011-10-06 VITALS — BP 118/60 | HR 74 | Ht 60.0 in | Wt 164.0 lb

## 2011-10-06 DIAGNOSIS — F329 Major depressive disorder, single episode, unspecified: Secondary | ICD-10-CM | POA: Insufficient documentation

## 2011-10-06 DIAGNOSIS — F3130 Bipolar disorder, current episode depressed, mild or moderate severity, unspecified: Secondary | ICD-10-CM

## 2011-10-06 DIAGNOSIS — F319 Bipolar disorder, unspecified: Secondary | ICD-10-CM

## 2011-10-06 MED ORDER — BUPROPION HCL ER (XL) 150 MG PO TB24: 150.0000 mg | ORAL_TABLET | Freq: Every day | ORAL | Status: DC

## 2011-10-06 NOTE — Patient Instructions (Signed)
Patient given handout in spanish about depression

## 2011-10-06 NOTE — Assessment & Plan Note (Signed)
Will start on Wellbutrin. One of her complaints is anhedonia with no desire for sex. She has a history of one month of hypomania in the past, no diagnosed history of mania. Will have a spanish counselor see her at home, the risk of mania from the medication was discussed and we will keep close follow up.

## 2011-10-06 NOTE — Progress Notes (Signed)
  Subjective:    Patient ID: Katherine Paul, female    DOB: Feb 04, 1986, 26 y.o.   MRN: [id number]  HPI 1. Loss of sexual desire. Patient here because she thought the Mirena is causing her to have a loss of desire for sex. She has had the mirena for 3 years, and loss of sexual desire for 2 years worse in last six months. 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 She has anhedonia, she cries all the time for no reason. She doesn't care whether she is working or at home because she has no interest in anything. She has stopped going out with friends. Her husband and her have poor intimacy and communication. She has a one month history in the last year where she was sleeping less, was working three jobs and two out of her house, neglected her 49 year old son, she was spending a lot of money, and her mood was elated. Her sister commented on her odd behavior as well, asking her why she was neglecting her child. During the interview she teared up when I started to ask about depression.  A PHQ-9 was filled out with a surprisingly low score of 7, however I think she was less than honest because she fears being diagnosed with depression.    Review of Systems Pertinent items are noted in HPI. No fever, chills, night sweats. She has purposeful weight loss from exercise.     Objective:   Physical Exam Filed Vitals:   10/06/11 1446  BP: 118/60  Pulse: 74  Height: 5' (1.524 m)  Weight: 164 lb (74.39 kg)   Psych:  Cognition and judgment appear intact. Alert, communicative  and cooperative with normal attention span and concentration. No apparent delusions, illusions, hallucinations No suicidality or homicidality. PHQ-9 score: 7     Assessment & Plan:

## 2011-10-20 ENCOUNTER — Ambulatory Visit (INDEPENDENT_AMBULATORY_CARE_PROVIDER_SITE_OTHER): Payer: Self-pay | Admitting: Family Medicine

## 2011-10-20 ENCOUNTER — Encounter: Payer: Self-pay | Admitting: Family Medicine

## 2011-10-20 VITALS — BP 108/62 | HR 69 | Ht 61.5 in | Wt 166.0 lb

## 2011-10-20 DIAGNOSIS — F329 Major depressive disorder, single episode, unspecified: Secondary | ICD-10-CM

## 2011-10-20 NOTE — Assessment & Plan Note (Signed)
No response to Bupropion 150 mg daily. She will increase to twice daily. I will call [employer] to see if the short acting form twice daily will be cheaper. She will take the ER tablets until used up. She doesn't know how many she has left

## 2011-10-20 NOTE — Progress Notes (Signed)
  Subjective:    Patient ID: [place], female    DOB: 1986-05-09, 26 y.o.   MRN: [id number]  HPI She's been taking the bupropion 150 mg standard release one daily for 2 weeks. She notes no side effects but is not feeling better as far as her sexual libido. On the pHQ 9 she only noted a lack of interest in doing things over half the time. She admits to a few days having a lack of energy or difficulty concentrating on things. She presents the main problem is her lack of sexual desire interfering with her relationship with her husband. She does not feel is really depressed as she did postpartum 4 years ago. She sleeps well and denies symptoms of illness otherwise.   She still wondering if the Biagio Quint is the cause of her lack of libido. Neither she nor her husband wish any more pregnancies.  She does not have insurance for medical costs and does not have Orthoptist. She has applied to and may have social security number in 2 months. Her husband has used condoms in the past for birth control. We discussed how these and the diaphragm could be considered but are not as effective as birth control pills or the IUD.  Review of Systems     Objective:   Physical Exam  Constitutional:       Obese  Neurological: She is alert. No cranial nerve deficit.  Psychiatric: Her behavior is normal. Judgment and thought content normal.       Somewhat flat affect          Assessment & Plan:

## 2011-10-20 NOTE — Patient Instructions (Signed)
Please return to see Dr Mauricio Po in 2 weeks.  Regrese para visitar Dr Mauricio Po en [facility] semanas  Aumente las dosis de Bupropion hasta 150 mg 2 pastillas cada dia. Eleccin del mtodo anticonceptivo  (Contraception Choices) El control de la natalidad (contracecin) impide que el [place]. Los diferentes tipos de anticonceptivos funcionan de [employer]. Algunos pueden:   Hacer que el moco del cuello del tero se espese. Esto les dificulta a los espermatozoides llegar al tero.   Hacer ms delgado el el revestimiento del tero. Esto dificulta que el vulo se adhiera a la pared del tero.   Impedir que los ovarios liberen un vulo.   Impedir que el esperma llegue al vulo.  Con ciertos tipos de [place] se puede impedir que el [place]. En las mujeres, [place] cierra las trompas de [place]opio (ligadura de trompas). En los hombres, la ciruga impide que los espermatozoides se liberen durante el acto sexual (vasectoma).  ANTICONCEPTIVOS [employer]  Los anticonceptivos hormonales impiden el [place], agregando hormonas al [employer]. Los tipos de anticonceptivos son:  Un pequeo tubo colocado bajo la piel de la parte superior del brazo (implante). El tubo puede Geneticist, molecular durante 3 aos.   Vacunas administradas cada 3 meses.   Pastillas que se toman [employer] o una vez despus de Warehouse manager sexo (relaciones sexuales).   Parches que se cambian una vez por semana.   Un anillo que se coloca en la vagina (anillos vaginales). El anillo se deja en su lugar durante 3 semanas y se retira durante 1 semana Luego se coloca un nuevo anillo.  ANTICONCEPTIVOS DE Lenis Noon  Los anticonceptivos de barrera impiden que los espermatozoides lleguen al vulo. Estos tipos de anticonceptivos son:   Darene Lamer delgada que se [place] sobe el pene (condn masculino) que se Diplomatic Services operational officer las relaciones sexuales.   Una cubierta blanda y suelta que se coloca en la vagina (condn femenino) antes  de las relaciones sexuales.   Un cuenco de goma que se aplica sobre el cuello del tero (diafragma). Este cuenco debe hacerse para usted. Se coloca en la vagina antes de Management consultant. Debe dejar el diafragma colocado en la vagina durante 6 a 8 horas despus de las [employer].   Un capuchn pequeo y [employer] se fijo sobre el cuello del tero (capuchn cervical). Este capuchn debe hacerse para usted. Debe dejarlo colocado en la vagina durante 48 horas despus de las [employer].   Una esponja que se coloca en la vagina antes de Management consultant.   Una sustancia qumica que destruye o impide que los espermatozoides ingresen al cuello y al tero (espermicida). La sustancia qumica puede ser en crema, gel, espuma o pldoras.  DISPOSITIVO DE CONTROL INTRAUTERINO (DIU)  El DIU es un pequeo dispositivo plstico en forma de T. Se coloca dentro del tero. Hay dos tipos de DIU:   DIU de cobre El dispositivo est cubierto en alambre de cobre. El cobre produce un lquido que [employer] espermatozoides. Puede permanecer colocado durante 10 aos.   DIU hormonal La hormona impide que Insurance underwriter. Puede permanecer colocado durante 5 aos.  CONTROL DE LA NATALIDAD POR PLANIFICACIN FAMILIAR NATURAL  La planificacin familiar natural significa no tener Clinical research associate o usar un mtodo anticonceptivo de Warehouse manager en los perodos frtiles de la [place]. Janne Napoleon puede:   Usar un calendario para saber cul es su perodo frtil.   Emplear un termmetro para Building services engineer.  Protjase de las enfermedades de transmisin sexual cualquiera sea el mtodo que [place]. Hable con su mdico acerca de cul es el mejor mtodo anticonceptivo para usted.  Document Released: 12/24/2010 Document Revised: 05/21/2011 [facility] Patient Information 2012 [place], [place].

## 2011-10-21 ENCOUNTER — Ambulatory Visit: Payer: Self-pay | Admitting: Family Medicine

## 2011-11-04 ENCOUNTER — Ambulatory Visit: Payer: Self-pay | Admitting: Family Medicine

## 2012-04-01 ENCOUNTER — Other Ambulatory Visit (HOSPITAL_COMMUNITY)
Admission: RE | Admit: 2012-04-01 | Discharge: 2012-04-01 | Disposition: A | Discharge status: Home / Self Care | Payer: Self-pay | Source: Ambulatory Visit | Attending: Family Medicine | Admitting: Family Medicine

## 2012-04-01 DIAGNOSIS — Z01419 Encounter for gynecological examination (general) (routine) without abnormal findings: Secondary | ICD-10-CM | POA: Insufficient documentation

## 2012-04-02 ENCOUNTER — Ambulatory Visit (INDEPENDENT_AMBULATORY_CARE_PROVIDER_SITE_OTHER): Payer: Self-pay | Admitting: Family Medicine

## 2012-04-02 ENCOUNTER — Encounter: Payer: Self-pay | Admitting: Family Medicine

## 2012-04-02 VITALS — BP 100/66 | HR 73 | Ht 61.0 in | Wt 170.0 lb

## 2012-04-02 DIAGNOSIS — F329 Major depressive disorder, single episode, unspecified: Secondary | ICD-10-CM

## 2012-04-02 DIAGNOSIS — Z124 Encounter for screening for malignant neoplasm of cervix: Secondary | ICD-10-CM

## 2012-04-02 NOTE — Patient Instructions (Addendum)
Fue un Research officer, trade union.  Me alegro que se esta' sintiendo mejor ahora.   Le hago contacto con los resultados del papanicolau.   Por favor llame si algo cambia; quiero saber si se siente muy deprimida o de alguna forma diferente de lo usual para Ud.   En todo caso, quiero verle en 3 meses para saber como esta'.  FOLLOW UP WITH DR Mauricio Po IN 3 MONTHS.

## 2012-04-02 NOTE — Progress Notes (Signed)
  Subjective:    Patient ID: [place], female    DOB: 06/03/1986, 26 y.o.   MRN: [id number]  HPI Visit conducted in Spanish.   Katherine Paul comes in for PAP today; says her mother has been on her case to get this done.  Has been several yrs since last Pap.  No vaginal discharge, no bleeding (has Mirena and thus has no menses).  No prior abnormal pap smears.   Is no longer taking the buproprion for depression.  Had been increased to 150mg  twice daily in January, took it for 1-2 months and then ran out.  She felt more relaxed and able to cope with stressors (such as crying children at home).  Has been relatively well since the med ran out with regard to this. Did experience a recent depression spell when her sister and nephew were kidnapped in [place].  She is still emotionally labile when talking about it.  They are well now, and her depression has since cleared up. Denies SI; her PHQ9 today is a 6 (2 pts for Q#7; 1 pt for Q#1,3,4,5).  Family Hx; No family hx gyn cancer or breast cancer.   Sleeps 11pm to 630am.  No recent hypomanic or manic episodes. Feels well from emotional standpoint off medicine.    Review of Systems     Objective:   Physical Exam Well appearing, appropriate responses to questions.  Emotionally labile when discussing her sister's kidnapping.  GYN: normal ext genitalia and vaginal mucosa. IUD strings seen at cervical os. Nonfriable cervix. Pap collected.  Bimanual exam without adnexal masses or tenderness.       Assessment & Plan:

## 2012-04-02 NOTE — Assessment & Plan Note (Signed)
Appears to be doing well from depression standpoint  She has had one depressive episode related to strong familial stressor.  No hypomania/mania.  She contracts that she will contact me if she is feeling depressed or manic again.  To see back in 3 months or sooner. Resistant to the idea of being on long-term medications, based on how she is feeling now.

## 2012-04-07 ENCOUNTER — Encounter: Payer: Self-pay | Admitting: Family Medicine

## 2016-09-22 NOTE — L&D Delivery Note (Signed)
Patient is 31 y.o. [id number] 874w0d admitted SOL. Prenatal course uncomplicated.  Delivery Note At 12:25 PM a viable female was delivered via Vaginal, Spontaneous Delivery (Presentation: LOA).  APGAR: 8, 9; weight pending.   Placenta status: Intact .  Cord: 3V with the following complications: None.  Cord pH: N/A  Anesthesia:  None Episiotomy: None Lacerations: 1st degree Suture Repair: monocyrl Est. Blood Loss (mL): 100  Mom to postpartum.  Baby to Couplet care / Skin to Skin.  Upon arrival patient was complete and pushing. She pushed with good maternal effort to deliver a viable infant. No nuchal cord present. Baby delivered without difficulty, was noted to have good tone and place on maternal abdomen for oral suctioning, drying and stimulation. Delayed cord clamping performed. Placenta delivered spontaneously with gentle cord traction. Fundus firm with massage and Pitocin. Perineum inspected and found to have 1st degree laceration, which was found to be hemostatic after repair. Counts of sharps, instruments, and lap pads were all correct.   Caryl AdaJazma Phelps, DO OB Fellow Faculty Practice, [facility] 05/07/2017, 1:12 PM

## 2016-11-24 ENCOUNTER — Other Ambulatory Visit (HOSPITAL_COMMUNITY): Payer: Self-pay | Admitting: Nurse Practitioner

## 2016-11-24 DIAGNOSIS — Z363 Encounter for antenatal screening for malformations: Secondary | ICD-10-CM

## 2016-11-24 LAB — OB RESULTS CONSOLE HIV ANTIBODY (ROUTINE TESTING): HIV: NONREACTIVE

## 2016-11-24 LAB — OB RESULTS CONSOLE GC/CHLAMYDIA
Chlamydia: NEGATIVE
Gonorrhea: NEGATIVE

## 2016-11-24 LAB — OB RESULTS CONSOLE ABO/RH: RH TYPE: POSITIVE

## 2016-11-24 LAB — OB RESULTS CONSOLE ANTIBODY SCREEN: ANTIBODY SCREEN: NEGATIVE

## 2016-11-24 LAB — OB RESULTS CONSOLE RPR: RPR: NONREACTIVE

## 2016-11-24 LAB — OB RESULTS CONSOLE RUBELLA ANTIBODY, IGM: Rubella: IMMUNE

## 2016-11-24 LAB — OB RESULTS CONSOLE HEPATITIS B SURFACE ANTIGEN: HEP B S AG: NEGATIVE

## 2016-12-12 ENCOUNTER — Encounter (HOSPITAL_COMMUNITY): Payer: Self-pay | Admitting: Nurse Practitioner

## 2016-12-18 ENCOUNTER — Ambulatory Visit (HOSPITAL_COMMUNITY)
Admission: RE | Admit: 2016-12-18 | Discharge: 2016-12-18 | Disposition: A | Discharge status: Home / Self Care | Payer: BLUE CROSS/BLUE SHIELD | Source: Ambulatory Visit | Attending: Nurse Practitioner | Admitting: Nurse Practitioner

## 2016-12-18 DIAGNOSIS — Z36 Encounter for antenatal screening for chromosomal anomalies: Secondary | ICD-10-CM | POA: Diagnosis present

## 2016-12-18 DIAGNOSIS — Z3A18 18 weeks gestation of pregnancy: Secondary | ICD-10-CM | POA: Diagnosis not present

## 2016-12-18 DIAGNOSIS — Z363 Encounter for antenatal screening for malformations: Secondary | ICD-10-CM

## 2017-04-13 ENCOUNTER — Inpatient Hospital Stay (HOSPITAL_COMMUNITY)
Admission: AD | Admit: 2017-04-13 | Discharge: 2017-04-13 | Disposition: A | Discharge status: Home / Self Care | Payer: Medicaid Other | Source: Ambulatory Visit | Attending: Family Medicine | Admitting: Family Medicine

## 2017-04-13 ENCOUNTER — Encounter (HOSPITAL_COMMUNITY): Payer: Self-pay

## 2017-04-13 DIAGNOSIS — O26893 Other specified pregnancy related conditions, third trimester: Secondary | ICD-10-CM | POA: Insufficient documentation

## 2017-04-13 DIAGNOSIS — O99891 Other specified diseases and conditions complicating pregnancy: Secondary | ICD-10-CM

## 2017-04-13 DIAGNOSIS — M549 Dorsalgia, unspecified: Secondary | ICD-10-CM | POA: Insufficient documentation

## 2017-04-13 DIAGNOSIS — R102 Pelvic and perineal pain: Secondary | ICD-10-CM

## 2017-04-13 DIAGNOSIS — Z3A34 34 weeks gestation of pregnancy: Secondary | ICD-10-CM | POA: Insufficient documentation

## 2017-04-13 DIAGNOSIS — O9989 Other specified diseases and conditions complicating pregnancy, childbirth and the puerperium: Secondary | ICD-10-CM

## 2017-04-13 LAB — URINALYSIS, ROUTINE W REFLEX MICROSCOPIC
BILIRUBIN URINE: NEGATIVE
Glucose, UA: NEGATIVE mg/dL
Hgb urine dipstick: NEGATIVE
KETONES UR: NEGATIVE mg/dL
Nitrite: NEGATIVE
Protein, ur: NEGATIVE mg/dL
SPECIFIC GRAVITY, URINE: 1.017 (ref 1.005–1.030)
pH: 5 (ref 5.0–8.0)

## 2017-04-13 LAB — WET PREP, GENITAL
Clue Cells Wet Prep HPF POC: NONE SEEN
SPERM: NONE SEEN
TRICH WET PREP: NONE SEEN
YEAST WET PREP: NONE SEEN

## 2017-04-13 NOTE — Discharge Instructions (Signed)
Pubic symphysis pain Do not vacuum or mop Tylenol OTC by the package directions Ice to your back for 15 minutes twice a day Get in the pool for exercise Do stretching exercises. Wear the pregnancy support belt

## 2017-04-13 NOTE — MAU Note (Signed)
Pt reports pain in vagina and burning since Friday. States she has a white discharge with a slight odor, but denies itching or irritation. Pt denies contractions or vaginal bleeding. Reports good fetal movement.

## 2017-04-13 NOTE — MAU Provider Note (Signed)
History     CSN: [id number]  Arrival date and time: 04/13/17 1916   First Provider Initiated Contact with Patient 04/13/17 2023      Chief Complaint  Patient presents with  . Vaginal Pain   HPI [place] Paul 31 y.o. 8415w4d Comes to MAU with pain in her groin bilaterally and pain in her vagina.  Began on Friday and has continued to be worse.  Did vigorous mopping on Thursday and pain has been worse since then.  No vaginal bleeding or leaking.  No contractions. Took tylenol tonight before coming in[facility]  Baby is moving well.   OB History    Gravida Para Term Preterm AB Living   3 1 1   1      SAB TAB Ectopic Multiple Live Births   1              History reviewed. No pertinent past medical history.  Past Surgical History:  Procedure Laterality Date  . CHOLECYSTECTOMY, LAPAROSCOPIC  01/31/08    No family history on file.  Social History  Substance Use Topics  . Smoking status: Never Smoker  . Smokeless tobacco: Not on file  . Alcohol use Not on file    Allergies: No Known Allergies  Prescriptions Prior to Admission  Medication Sig Dispense Refill Last Dose  . buPROPion (WELLBUTRIN XL) 150 MG 24 hr tablet Take 1 tablet (150 mg total) by mouth daily. 90 tablet 4     Review of Systems  Constitutional: Negative for fever.  Gastrointestinal: Negative for abdominal pain, nausea and vomiting.  Genitourinary: Positive for vaginal discharge and vaginal pain. Negative for dysuria and vaginal bleeding.  Musculoskeletal: Positive for back pain.   Physical Exam   Blood pressure 110/77, pulse 99, temperature 98.5 F (36.9 C), temperature source Oral, resp. rate 16, height 5\' 1"  (1.549 m), weight 191 lb (86.6 kg), SpO2 99 %.  Physical Exam  Nursing note and vitals reviewed. Constitutional: She is oriented to person, place, and time. She appears well-developed and well-nourished.  HENT:  Head: Normocephalic.  Eyes: EOM are normal.  Neck: Neck  supple.  GI: Soft. There is no tenderness.  FHT 140 baseline with moderate variability and 15x15 accels - reactive strip.  Occasional contraction noted on the strip that client does not feel  Genitourinary:  Genitourinary Comments: Speculum exam: Vagina - Small amount of discharge, no odor Cervix - No contact bleeding Bimanual exam: Cervix 1-2cm, thick, vtx at -3 station GC/Chlam, wet prep done Chaperone present for exam.   Musculoskeletal: Normal range of motion.  Painful pubic symphysis  Neurological: She is alert and oriented to person, place, and time.  Skin: Skin is warm and dry.  Psychiatric: She has a normal mood and affect.    MAU Course  Procedures Results for orders placed or performed during the hospital encounter of 04/13/17 (from the past 24 hour(s))  Urinalysis, Routine w reflex microscopic     Status: Abnormal   Collection Time: 04/13/17  8:07 PM  Result Value Ref Range   Color, Urine YELLOW YELLOW   APPearance HAZY (A) CLEAR   Specific Gravity, Urine 1.017 1.005 - 1.030   pH 5.0 5.0 - 8.0   Glucose, UA NEGATIVE NEGATIVE mg/dL   Hgb urine dipstick NEGATIVE NEGATIVE   Bilirubin Urine NEGATIVE NEGATIVE   Ketones, ur NEGATIVE NEGATIVE mg/dL   Protein, ur NEGATIVE NEGATIVE mg/dL   Nitrite NEGATIVE NEGATIVE   Leukocytes, UA TRACE (A) NEGATIVE  RBC / HPF 0-5 0 - 5 RBC/hpf   WBC, UA 0-5 0 - 5 WBC/hpf   Bacteria, UA RARE (A) NONE SEEN   Squamous Epithelial / LPF 6-30 (A) NONE SEEN   Mucous PRESENT   Wet prep, genital     Status: Abnormal   Collection Time: 04/13/17  8:39 PM  Result Value Ref Range   Yeast Wet Prep HPF POC NONE SEEN NONE SEEN   Trich, Wet Prep NONE SEEN NONE SEEN   Clue Cells Wet Prep HPF POC NONE SEEN NONE SEEN   WBC, Wet Prep HPF POC MODERATE (A) NONE SEEN   Sperm NONE SEEN     MDM   Assessment and Plan  Pubic symphysis pain and back pain in pregnancy No labor  Plan OTC Tylenol by the package directions Stop mopping and vacuuming as  these aggravate your pain Demonstrated stretching exercises for her Ice packs to her back for 15 minutes twice a day - no ice directly on her skin Drink at least 8 8-oz glasses of water every day. Get in the pool if possible for stretching and exercise Use your pregnancy support belt daily. Return if you have contractions, leaking of fluid or vaginal bleeding. Reassured that this pubic symphysis pain will resolve after the birth of the baby.  Sire Poet L Mihaela Fajardo 04/13/2017, 9:02 PM

## 2017-04-14 LAB — GC/CHLAMYDIA PROBE AMP (~~LOC~~) NOT AT ARMC
CHLAMYDIA, DNA PROBE: NEGATIVE
Neisseria Gonorrhea: NEGATIVE

## 2017-04-20 LAB — OB RESULTS CONSOLE GC/CHLAMYDIA
CHLAMYDIA, DNA PROBE: NEGATIVE
Gonorrhea: NEGATIVE

## 2017-04-20 LAB — OB RESULTS CONSOLE GBS: STREP GROUP B AG: NEGATIVE

## 2017-05-07 ENCOUNTER — Encounter (HOSPITAL_COMMUNITY): Payer: Self-pay | Admitting: *Deleted

## 2017-05-07 ENCOUNTER — Inpatient Hospital Stay (HOSPITAL_COMMUNITY)
Admission: AD | Admit: 2017-05-07 | Discharge: 2017-05-09 | DRG: 775 | Disposition: A | Discharge status: Home / Self Care | Payer: BLUE CROSS/BLUE SHIELD | Source: Ambulatory Visit | Attending: Family Medicine | Admitting: Family Medicine

## 2017-05-07 DIAGNOSIS — Z3A38 38 weeks gestation of pregnancy: Secondary | ICD-10-CM

## 2017-05-07 DIAGNOSIS — Z3493 Encounter for supervision of normal pregnancy, unspecified, third trimester: Secondary | ICD-10-CM | POA: Diagnosis present

## 2017-05-07 LAB — CBC
HCT: 36.4 % (ref 36.0–46.0)
Hemoglobin: 12.1 g/dL (ref 12.0–15.0)
MCH: 30.4 pg (ref 26.0–34.0)
MCHC: 33.2 g/dL (ref 30.0–36.0)
MCV: 91.5 fL (ref 78.0–100.0)
PLATELETS: 186 10*3/uL (ref 150–400)
RBC: 3.98 MIL/uL (ref 3.87–5.11)
RDW: 14.7 % (ref 11.5–15.5)
WBC: 11 10*3/uL — AB (ref 4.0–10.5)

## 2017-05-07 LAB — TYPE AND SCREEN
ABO/RH(D): O POS
ANTIBODY SCREEN: NEGATIVE

## 2017-05-07 MED ORDER — DIPHENHYDRAMINE HCL 25 MG PO CAPS: 25.0000 mg | ORAL_CAPSULE | Freq: Four times a day (QID) | ORAL | Status: DC | PRN

## 2017-05-07 MED ORDER — SIMETHICONE 80 MG PO CHEW: 80.0000 mg | CHEWABLE_TABLET | ORAL | Status: DC | PRN

## 2017-05-07 MED ORDER — OXYTOCIN BOLUS FROM INFUSION: 500.0000 mL | Freq: Once | INTRAVENOUS | Status: DC

## 2017-05-07 MED ORDER — OXYCODONE-ACETAMINOPHEN 5-325 MG PO TABS
1.0000 | ORAL_TABLET | ORAL | Status: DC | PRN
  Administered 2017-05-07: 1 via ORAL
  Filled 2017-05-07: qty 1

## 2017-05-07 MED ORDER — SENNOSIDES-DOCUSATE SODIUM 8.6-50 MG PO TABS
2.0000 | ORAL_TABLET | ORAL | Status: DC
  Administered 2017-05-07 – 2017-05-08 (×2): 2 via ORAL
  Filled 2017-05-07 (×2): qty 2

## 2017-05-07 MED ORDER — DIBUCAINE 1 % RE OINT: 1.0000 "application " | TOPICAL_OINTMENT | RECTAL | Status: DC | PRN

## 2017-05-07 MED ORDER — ONDANSETRON HCL 4 MG/2ML IJ SOLN: 4.0000 mg | INTRAMUSCULAR | Status: DC | PRN

## 2017-05-07 MED ORDER — PRENATAL MULTIVITAMIN CH
1.0000 | ORAL_TABLET | Freq: Every day | ORAL | Status: DC
  Administered 2017-05-08 – 2017-05-09 (×2): 1 via ORAL
  Filled 2017-05-07 (×2): qty 1

## 2017-05-07 MED ORDER — ACETAMINOPHEN 325 MG PO TABS
650.0000 mg | ORAL_TABLET | ORAL | Status: DC | PRN
  Administered 2017-05-08: 650 mg via ORAL
  Filled 2017-05-07: qty 2

## 2017-05-07 MED ORDER — WITCH HAZEL-GLYCERIN EX PADS: 1.0000 "application " | MEDICATED_PAD | CUTANEOUS | Status: DC | PRN

## 2017-05-07 MED ORDER — OXYTOCIN 40 UNITS IN LACTATED RINGERS INFUSION - SIMPLE MED: 2.5000 [IU]/h | INTRAVENOUS | Status: DC

## 2017-05-07 MED ORDER — LACTATED RINGERS IV SOLN: INTRAVENOUS | Status: DC

## 2017-05-07 MED ORDER — ONDANSETRON HCL 4 MG PO TABS: 4.0000 mg | ORAL_TABLET | ORAL | Status: DC | PRN

## 2017-05-07 MED ORDER — ONDANSETRON HCL 4 MG/2ML IJ SOLN: 4.0000 mg | Freq: Four times a day (QID) | INTRAMUSCULAR | Status: DC | PRN

## 2017-05-07 MED ORDER — SOD CITRATE-CITRIC ACID 500-334 MG/5ML PO SOLN: 30.0000 mL | ORAL | Status: DC | PRN

## 2017-05-07 MED ORDER — COCONUT OIL OIL: 1.0000 "application " | TOPICAL_OIL | Status: DC | PRN

## 2017-05-07 MED ORDER — BENZOCAINE-MENTHOL 20-0.5 % EX AERO: 1.0000 "application " | INHALATION_SPRAY | CUTANEOUS | Status: DC | PRN

## 2017-05-07 MED ORDER — ACETAMINOPHEN 325 MG PO TABS: 650.0000 mg | ORAL_TABLET | ORAL | Status: DC | PRN

## 2017-05-07 MED ORDER — LACTATED RINGERS IV SOLN: 500.0000 mL | INTRAVENOUS | Status: DC | PRN

## 2017-05-07 MED ORDER — OXYCODONE-ACETAMINOPHEN 5-325 MG PO TABS: 2.0000 | ORAL_TABLET | ORAL | Status: DC | PRN

## 2017-05-07 MED ORDER — IBUPROFEN 600 MG PO TABS
600.0000 mg | ORAL_TABLET | Freq: Four times a day (QID) | ORAL | Status: DC
  Administered 2017-05-07 – 2017-05-09 (×8): 600 mg via ORAL
  Filled 2017-05-07 (×8): qty 1

## 2017-05-07 MED ORDER — LIDOCAINE HCL (PF) 1 % IJ SOLN
30.0000 mL | INTRAMUSCULAR | Status: AC | PRN
  Administered 2017-05-07: 30 mL via SUBCUTANEOUS
  Filled 2017-05-07: qty 30

## 2017-05-07 MED ORDER — ZOLPIDEM TARTRATE 5 MG PO TABS: 5.0000 mg | ORAL_TABLET | Freq: Every evening | ORAL | Status: DC | PRN

## 2017-05-07 MED ORDER — OXYTOCIN 10 UNIT/ML IJ SOLN
INTRAMUSCULAR | Status: AC
  Administered 2017-05-07: 10 [IU] via INTRAMUSCULAR
  Filled 2017-05-07: qty 1

## 2017-05-07 MED ORDER — TETANUS-DIPHTH-ACELL PERTUSSIS 5-2.5-18.5 LF-MCG/0.5 IM SUSP: 0.5000 mL | Freq: Once | INTRAMUSCULAR | Status: DC

## 2017-05-07 MED ORDER — FLEET ENEMA 7-19 GM/118ML RE ENEM: 1.0000 | ENEMA | RECTAL | Status: DC | PRN

## 2017-05-07 NOTE — MAU Note (Signed)
FHR 140's SVE 9.5/100% BBOW  MD called--Admit patient Patient taken directly to L/D Bed with RN and CNM bedside via stretcher.

## 2017-05-07 NOTE — H&P (Signed)
Obstetric History and Physical  Katherine Paul is a 31 y.o. G3P1011 with IUP at [redacted]w[redacted]d presenting for advanced labor. Patient states she has been having  Regular contractions, minimal vaginal bleeding, intact membranes, with active fetal movement.    Prenatal Course Source of Care: HD  with onset of care at 14 weeks Dating: By LMP --->  Estimated Date of Delivery: 05/21/17 Pregnancy complications or risks: Patient Active Problem List   Diagnosis Date Noted  . Normal labor 05/07/2017  . Major depression 10/06/2011  . LEUKORRHEA 12/09/2007  . POSTPARTUM DEPRESSION, MILD 11/15/2007  . CONSTIPATION 08/25/2007  . CHOLELITHIASIS W/O CHOLECYSTITIS W/O OBST 06/15/2007  . SYMPTOM, DYSURIA 06/07/2007  . ABDOMINAL PAIN, EPIGASTRIC 05/18/2007  . HEMATURIA 05/14/2007  . HEMORRHAGE, ANTEPARTUM NOS, UNSPC EPISODE 05/14/2007   She plans to breastfeed She desires undecided for postpartum contraception.   Prenatal labs and studies: ABO, Rh: O/Positive/-- (03/05 0000) Antibody: Negative (03/05 0000) Rubella: Immune (03/05 0000) RPR: Nonreactive (03/05 0000)  HBsAg: Negative (03/05 0000)  HIV: Non-reactive (03/05 0000)  MVH:QIONGEXB (07/30 0000) 1 hr Glucola  normal Genetic screening normal Anatomy US normal  Prenatal Transfer Tool  Maternal Diabetes: No Genetic Screening: Normal Maternal Ultrasounds/Referrals: Normal Fetal Ultrasounds or other Referrals:  None Maternal Substance Abuse:  No Significant Maternal Medications:  None Significant Maternal Lab Results: Lab values include: Group B Strep negative  History reviewed. No pertinent past medical history.  Past Surgical History:  Procedure Laterality Date  . CHOLECYSTECTOMY, LAPAROSCOPIC  01/31/08    OB History  Gravida Para Term Preterm AB Living  3 1 1   1 1   SAB TAB Ectopic Multiple Live Births  1            # Outcome Date GA Lbr Len/2nd Weight Sex Delivery Anes PTL Lv  3 Current           2 Term      Vag-Spont     1 SAB                Social History   Social History  . Marital status: Single    Spouse name: N/A  . Number of children: N/A  . Years of education: N/A   Social History Main Topics  . Smoking status: Never Smoker  . Smokeless tobacco: Never Used  . Alcohol use No  . Drug use: No  . Sexual activity: Yes   Other Topics Concern  . None   Social History Narrative  . None    History reviewed. No pertinent family history.  No prescriptions prior to admission.    No Known Allergies  Review of Systems: Negative except for what is mentioned in HPI.  Physical Exam: BP 114/81   Pulse 91   Temp 98.2 F (36.8 C) (Oral)   Resp 18   Ht 5\' 1"  (1.549 m)   Wt 193 lb (87.5 kg)   BMI 36.47 kg/m  CONSTITUTIONAL: Well-developed, well-nourished female in no acute distress.  HENT:  Normocephalic, atraumatic, External right and left ear normal. Oropharynx is clear and moist EYES: Conjunctivae and EOM are normal. Pupils are equal, round, and reactive to light. No scleral icterus.  NECK: Normal range of motion, supple, no masses SKIN: Skin is warm and dry. No rash noted. Not diaphoretic. No erythema. No pallor. NEUROLOGIC: Alert and oriented to person, place, and time. Normal reflexes, muscle tone coordination. No cranial nerve deficit noted. PSYCHIATRIC: Normal mood and affect. Normal behavior. Normal judgment and thought content. CARDIOVASCULAR:  Normal heart rate noted, regular rhythm RESPIRATORY: Effort and breath sounds normal, no problems with respiration noted ABDOMEN: Soft, nontender, nondistended, gravid. MUSCULOSKELETAL: Normal range of motion. No edema and no tenderness. 2+ distal pulses.  Dilation: Lip/rim Effacement (%): 100 Cervical Position: Middle Station: +1 Presentation: Vertex Exam by:: Janeth Rasehristina Robinson RNC   Presentation: cephalic FHT:  Baseline rate 145 bpm   Variability moderate  Accelerations present   Decelerations none Contractions: Every 1-2 mins   Pertinent  Labs/Studies:   No results found for this or any previous visit (from the past 24 hour(s)).  Assessment : Katherine Paul is a 31 y.o. G3P1011 at 6856w0d being admitted for labor.  Plan: Labor: Expectant management FWB: Reassuring fetal heart tracing.  GBS negative Delivery plan: Hopeful for vaginal delivery   Caryl AdaJazma Phelps, DO OB Fellow Faculty Practice, [facility] [place] [facility] 05/07/2017, 12:52 PM

## 2017-05-07 NOTE — MAU Note (Signed)
Contractions every 2-3 in.  2nd baby . No bleeding or leaking.  2 was 2+ when last checked.

## 2017-05-08 LAB — RPR: RPR Ser Ql: NONREACTIVE

## 2017-05-08 NOTE — Lactation Note (Signed)
This note was copied from a baby's chart. Lactation Consultation Note  Patient Name: Katherine Paul Today's Date: 05/08/2017 Reason for consult: Follow-up assessment   With this mom and term baby, now 52 hours old. I had Eda, hospital Spanish interpreter in the room. I explained cluster feeding, and how adding formula can lead to a decrease in her breast milk. I gave mom the formula amounts sheet, and outlined the amounts to go along with breast feeding. I also measured out 10 ml's of formula in a seperate bottle, so limitet the amounts of formula taken at each formula feeding. Mom knows to breast feed first, and then offer formula as needed.    Maternal Data Formula Feeding for Exclusion: No Has patient been taught Hand Expression?: Yes Does the patient have breastfeeding experience prior to this delivery?: Yes  Feeding Feeding Type: Breast Fed Length of feed:  (latched at 1215)  LATCH Score Latch: Grasps breast easily, tongue down, lips flanged, rhythmical sucking.  Audible Swallowing: A few with stimulation  Type of Nipple: Everted at rest and after stimulation  Comfort (Breast/Nipple): Soft / non-tender  Hold (Positioning): Assistance needed to correctly position infant at breast and maintain latch.  LATCH Score: 8  Interventions    Lactation Tools Discussed/Used     Consult Status Consult Status: Complete Date: 05/09/17 Follow-up type: Call as needed    Alfred Levins 05/08/2017, 12:24 PM

## 2017-05-08 NOTE — Progress Notes (Signed)
UR chart review completed.  

## 2017-05-08 NOTE — Progress Notes (Signed)
Post Partum Day 1  Subjective: no complaints, up ad lib, voiding, tolerating PO, + flatus and pain well controlled  Objective: Blood pressure 117/62, pulse 70, temperature 98.6 F (37 C), temperature source Oral, resp. rate 14, height 5\' 1"  (1.549 m), weight 87.5 kg (193 lb), unknown if currently breastfeeding.  Physical Exam:  General: alert, cooperative and no distress Lochia: appropriate Uterine Fundus: firm Incision: n/a DVT Evaluation: No evidence of DVT seen on physical exam.   Recent Labs  05/07/17 1310  HGB 12.1  HCT 36.4    Assessment/Plan: Discharge home and Contraception IUD   LOS: 1 day   Tillman Sers 05/08/2017, 7:17 AM

## 2017-05-08 NOTE — Lactation Note (Signed)
This note was copied from a baby's chart. Lactation Consultation Note  Patient Name: Girl Lyzbeth Moisa Today's Date: 05/08/2017 Reason for consult: Initial assessment   With this experienced breast feeding mom . Mom is concerned that her baby is feeding too much. I feel, at 79 hours old, the baby is cluster feeding. I explained that mom has colostrum only for the first 2 days, and by tomorrow, 48-72 hours pp, mom's milk will transition in, and baby will be more satisfied. Mom denies any discomfort with latch, and I told her it is fine to feed the baby with cues. Th e baby has more than adequate  out put at this time.  I did call Eda, Spanish interpreter, and she is going to come in with me, to interpret for mom,. so I can better explain to her the above.    Maternal Data Formula Feeding for Exclusion: No Has patient been taught Hand Expression?: Yes Does the patient have breastfeeding experience prior to this delivery?: Yes  Feeding Feeding Type: Breast Fed Length of feed: 55 min  LATCH Score                   Interventions    Lactation Tools Discussed/Used     Consult Status Consult Status: Follow-up Date: 05/09/17 Follow-up type: In-patient    Alfred Levins 05/08/2017, 10:37 AM

## 2017-05-08 NOTE — Discharge Summary (Signed)
Obstetric Discharge Summary Reason for Admission: onset of labor Prenatal Procedures: ultrasound Intrapartum Procedures: spontaneous vaginal delivery Postpartum Procedures: none Complications-Operative and Postpartum: first degree perineal laceration  Upon arrival patient was complete and pushing. She pushed with good maternal effort to deliver a viable infant. No nuchal cord present. Baby delivered without difficulty, was noted to have good tone and place on maternal abdomen for oral suctioning, drying and stimulation. Delayed cord clamping performed. Placenta delivered spontaneously with gentle cord traction. Fundus firm with massage and Pitocin. Perineum inspected and found to have 1st degree laceration, which was found to be hemostatic after repair. Counts of sharps, instruments, and lap pads were all correct.  Hospital Course:  Active Problems:   Normal labor   NSVD (normal spontaneous vaginal delivery)   [place] is a 31 y.o. [id number] s/p SVD.  Patient was admitted on 05/07/17.  She has postpartum course that was uncomplicated including no problems with ambulating, PO intake, urination, pain, or bleeding. The pt feels ready to go home and  will be discharged with outpatient follow-up.   Today: No acute events overnight.  Pt denies problems with ambulating, voiding or po intake.  She denies nausea or vomiting.  Pain is well controlled.  She has had flatus. She has not had bowel movement.  Lochia Moderate.  Plan for birth control is  IUD.  Method of Feeding: Breast   Physical Exam:  General: alert, cooperative, appears stated age and no distress Lochia: appropriate Uterine Fundus: firm  H/H: Lab Results  Component Value Date/Time   HGB 12.1 05/07/2017 01:10 PM   HCT 36.4 05/07/2017 01:10 PM    Discharge Diagnoses: Term Pregnancy-delivered  Discharge Information: Date: 05/09/2017 Activity: pelvic rest Diet: routine  Medications: None Breast feeding:  Yes Condition:  stable Instructions: refer to handout Discharge to: home    Allergies as of 05/09/2017   No Known Allergies     Medication List    TAKE these medications   acetaminophen 500 MG tablet Commonly known as:  TYLENOL Take 500 mg by mouth every 6 (six) hours as needed for mild pain.   prenatal multivitamin Tabs tablet Take 1 tablet by mouth daily at 12 noon.        [name] ,MD FM Resident, PGY-1 05/09/2017,8:32 AM

## 2017-05-08 NOTE — Progress Notes (Signed)
MOB was referred for history of depression/anxiety. * Referral screened out by Clinical Social Worker because none of the following criteria appear to apply: ~ History of anxiety/depression during this pregnancy, or of post-partum depression. ~ Diagnosis of anxiety and/or depression within last 3 years OR * MOB's symptoms currently being treated with medication and/or therapy.  Please contact the Clinical Social Worker if needs arise, by MOB request, or if MOB scores 9 or greater/yes to question 10 on [place] Postpartum Depression Screen.   Katherine Paul, MSW, LCSW Clinical Social Work [phone]  

## 2017-05-08 NOTE — Progress Notes (Signed)
MOB was referred for history of depression/anxiety. * Referral screened out by Clinical Social Worker because none of the following criteria appear to apply: ~ History of anxiety/depression during this pregnancy, or of post-partum depression. ~ Diagnosis of anxiety and/or depression within last 3 years OR * MOB's symptoms currently being treated with medication and/or therapy.  Please contact the Clinical Social Worker if needs arise, by MOB request, or if MOB scores 9 or greater/yes to question 10 on [place] Postpartum Depression Screen.   Jalila Goodnough Boyd-Gilyard, MSW, LCSW Clinical Social Work [phone]  

## 2017-05-09 NOTE — Lactation Note (Signed)
This note was copied from a baby's chart. Lactation Consultation Note; Wallene Huh Spanish interpreter present for my visit. Assisted mom with getting deeper latch. Reviewed basics. Used football hold instead of cradle and Mom reports she can feel the difference with the deeper latch. Nursed for 15 min on right breast and still nursing on left as I left room. Does not have pump for home yet. Plans to get one from [facility]. Manual pump given with # 27 flange- reviewed setup, use and cleaning of pump pieces. No questions at present. To call for assist prn  Patient Name: Katherine Paul Today's Date: 05/09/2017 Reason for consult: Follow-up assessment   Maternal Data Formula Feeding for Exclusion: No Has patient been taught Hand Expression?: Yes Does the patient have breastfeeding experience prior to this delivery?: Yes  Feeding Feeding Type: Breast Fed Length of feed: 30 min  LATCH Score Latch: Grasps breast easily, tongue down, lips flanged, rhythmical sucking.  Audible Swallowing: A few with stimulation  Type of Nipple: Everted at rest and after stimulation  Comfort (Breast/Nipple): Soft / non-tender  Hold (Positioning): Assistance needed to correctly position infant at breast and maintain latch.  LATCH Score: 8  Interventions Interventions: Breast feeding basics reviewed;Assisted with latch;Breast massage;Hand express;Breast compression;Support pillows  Lactation Tools Discussed/Used WIC Program: Yes Pump Review: Setup, frequency, and cleaning;Milk Storage Initiated by:: DW Date initiated:: 05/09/17   Consult Status Consult Status: Follow-up Date: 05/10/17 Follow-up type: In-patient    Pamelia Hoit 05/09/2017, 10:17 AM

## 2017-05-09 NOTE — Progress Notes (Deleted)
OB Discharge Summary     Patient Name: Katherine Paul DOB: 1986-01-11 MRN: [id number]  Date of admission: 05/07/2017 Delivering MD: Pincus Large   Date of discharge: 05/09/2017  Admitting diagnosis: 37WKS,CTX Intrauterine pregnancy: [redacted]w[redacted]d     Secondary diagnosis:  Active Problems:   Normal labor   NSVD (normal spontaneous vaginal delivery)  Additional problems: none     Discharge diagnosis: Term Pregnancy Delivered                                                                                                Post partum procedures:1st degree laceration repair  Augmentation: None  Complications: None  Hospital course:  Onset of Labor With Vaginal Delivery     31 y.o. yo [id number] at [redacted]w[redacted]d was admitted in Active Labor on 05/07/2017. Patient had an uncomplicated labor course as follows:  Membrane Rupture Time/Date: 11:25 AM ,05/07/2017   Intrapartum Procedures: Episiotomy: None [1]                                         Lacerations:  1st degree Patient had a delivery of a Viable infant. 05/07/2017  Information for the patient's newborn:  Gaylia, Kassel [[id number]]  Delivery Method: Vaginal, Spontaneous Delivery (Filed from Delivery Summary)    Pateint had an uncomplicated postpartum course.  She is ambulating, tolerating a regular diet, passing flatus, and urinating well. Patient is discharged home in stable condition on 05/09/17.   Physical exam  Vitals:   05/07/17 1848 05/08/17 0300 05/08/17 1827 05/09/17 0620  BP: (!) 113/50 117/62 (!) 107/56 110/68  Pulse: 72 70 70 71  Resp: 18 14 19 18   Temp: 98.2 F (36.8 C) 98.6 F (37 C) 98.1 F (36.7 C) 98 F (36.7 C)  TempSrc: Oral Oral Oral Oral  SpO2:    100%  Weight:      Height:       General: alert, cooperative and no distress Lochia: appropriate Uterine Fundus: firm Incision: N/A DVT Evaluation: No evidence of DVT seen on physical exam. Negative Homan's sign. No cords or calf tenderness. No significant  calf/ankle edema. Labs: Lab Results  Component Value Date   WBC 11.0 (H) 05/07/2017   HGB 12.1 05/07/2017   HCT 36.4 05/07/2017   MCV 91.5 05/07/2017   PLT 186 05/07/2017   CMP Latest Ref Rng & Units 07/04/2008  Glucose - 94  BUN - 13  Creatinine - 0.8  Sodium - 139  Potassium - 4.2  Chloride - 108  CO2 19 - 32 meq/L -  Calcium 8.4 - 10.5 mg/dL -  Total Protein - -  Total Bilirubin - -  Alkaline Phos - -  AST - -  ALT - -    Discharge instruction: per After Visit Summary and "Baby and Me Booklet".  After visit meds:  Allergies as of 05/09/2017   No Known Allergies     Medication List    TAKE these medications   acetaminophen 500 MG tablet Commonly  known as:  TYLENOL Take 500 mg by mouth every 6 (six) hours as needed for mild pain.   prenatal multivitamin Tabs tablet Take 1 tablet by mouth daily at 12 noon.       Diet: routine diet  Activity: Advance as tolerated. Pelvic rest for 6 weeks.   Outpatient follow up:2 weeks Follow up Appt:No future appointments. Follow up Visit:No Follow-up on file.  Postpartum contraception: IUD Mirena  Newborn Data: Live born female  Birth Weight: 6 lb 15.1 oz (3150 g) APGAR: 8, 9  Baby Feeding: Bottle and Breast Disposition:home with mother   05/09/2017 [name], MD

## 2017-05-09 NOTE — Progress Notes (Signed)
Discharge instructions given to mom via in house interpreter. Mom verbalizes understanding that baby is a baby patient. Interpreter to continue to order meals for mom. Mom understands someone must be in room with newborn at all times. Phototherapy for baby started and explain to mom.

## 2017-05-09 NOTE — Progress Notes (Signed)
Post Partum Day 2 Subjective: up ad lib, voiding, tolerating PO, + flatus and concerns about frequent, long latching with low milk production.  Would appreciate lactation consultant to see her again today.  Objective: Blood pressure 110/68, pulse 71, temperature 98 F (36.7 C), temperature source Oral, resp. rate 18, height 5\' 1"  (1.549 m), weight 87.5 kg (193 lb), SpO2 100 %, unknown if currently breastfeeding.  Physical Exam:  General: alert, cooperative and appears stated age Lochia: appropriate Uterine Fundus: firm DVT Evaluation: No evidence of DVT seen on physical exam. Negative Homan's sign. No cords or calf tenderness. No significant calf/ankle edema.   Recent Labs  05/07/17 1310  HGB 12.1  HCT 36.4    Assessment/Plan: Discharge home, Breastfeeding, Lactation consult and Contraception IUD   LOS: 2 days   [name] 05/09/2017, 7:41 AM   CNM attestation I have seen and examined this patient and agree with above documentation in the resident's note.   Talore Cline is a 31 y.o. [id number] s/p SVD.   Pain is well controlled.  Plan for birth control is IUD.  Method of Feeding: breast  PE:  BP 110/68 (BP Location: Left Arm)   Pulse 71   Temp 98 F (36.7 C) (Oral)   Resp 18   Ht 5\' 1"  (1.549 m)   Wt 87.5 kg (193 lb)   SpO2 100%   Breastfeeding? Unknown   BMI 36.47 kg/m  Fundus firm   Recent Labs  05/07/17 1310  HGB 12.1  HCT 36.4     Plan: discharge today - postpartum care discussed - f/u clinic in 4 weeks for postpartum visit   Cam Hai, CNM 8:54 AM 05/09/2017

## 2017-05-09 NOTE — Discharge Summary (Signed)
OB Discharge Summary     Katherine Paul Name: Katherine Paul DOB: 02-Feb-1986 MRN: [id number]  Date of admission: 05/07/2017 Delivering MD: Pincus Large   Date of discharge: 05/09/2017  Admitting diagnosis: 37WKS,CTX Intrauterine pregnancy: [redacted]w[redacted]d     Secondary diagnosis:  Principal Problem:   NSVD (normal spontaneous vaginal delivery)  Additional problems: hx depression     Discharge diagnosis: Term Pregnancy Delivered                                                                                                Post partum procedures:1st degree laceration repair  Augmentation: None  Complications: None  Hospital course:  Onset of Labor With Vaginal Delivery     31 y.o. yo [id number] at [redacted]w[redacted]d was admitted in Active Labor on 05/07/2017. Katherine Paul had an uncomplicated labor course as follows:  Membrane Rupture Time/Date: 11:25 AM ,05/07/2017   Intrapartum Procedures: Episiotomy: None [1]                                         Lacerations:  One first degree Katherine Paul had a delivery of a Viable infant. 05/07/2017  Information for the Katherine Paul's newborn:  Katherine, Paul [[id number]]  Delivery Method: Vaginal, Spontaneous Delivery (Filed from Delivery Summary)    Katherine Paul had an uncomplicated postpartum course.  Katherine Paul is ambulating, tolerating a regular diet, passing flatus, and urinating well. Katherine Paul is discharged home in stable condition on 05/09/17.   Physical exam  Vitals:   05/07/17 1848 05/08/17 0300 05/08/17 1827 05/09/17 0620  BP: (!) 113/50 117/62 (!) 107/56 110/68  Pulse: 72 70 70 71  Resp: 18 14 19 18   Temp: 98.2 F (36.8 C) 98.6 F (37 C) 98.1 F (36.7 C) 98 F (36.7 C)  TempSrc: Oral Oral Oral Oral  SpO2:    100%  Weight:      Height:       General: alert, cooperative and no distress Lochia: appropriate Uterine Fundus: firm DVT Evaluation: No evidence of DVT seen on physical exam. Negative Homan's sign. No cords or calf tenderness. No significant calf/ankle  edema. Labs: Lab Results  Component Value Date   WBC 11.0 (H) 05/07/2017   HGB 12.1 05/07/2017   HCT 36.4 05/07/2017   MCV 91.5 05/07/2017   PLT 186 05/07/2017   CMP Latest Ref Rng & Units 07/04/2008  Glucose - 94  BUN - 13  Creatinine - 0.8  Sodium - 139  Potassium - 4.2  Chloride - 108  CO2 19 - 32 meq/L -  Calcium 8.4 - 10.5 mg/dL -  Total Protein - -  Total Bilirubin - -  Alkaline Phos - -  AST - -  ALT - -    Discharge instruction: per After Visit Summary and "Baby and Me Booklet".  After visit meds:  Allergies as of 05/09/2017   No Known Allergies     Medication List    TAKE these medications   acetaminophen 500 MG tablet Commonly known as:  TYLENOL  Take 500 mg by mouth every 6 (six) hours as needed for mild pain.   prenatal multivitamin Tabs tablet Take 1 tablet by mouth daily at 12 noon.       Diet: routine diet  Activity: Advance as tolerated. Pelvic rest for 6 weeks.   Outpatient follow up:4-6 weeks Follow up Appt:No future appointments. Follow up Visit:No Follow-up on file.  Postpartum contraception: IUD unknown type  Newborn Data: Live born female  Birth Weight: 6 lb 15.1 oz (3150 g) APGAR: 8, 9  Baby Feeding: Breast Disposition:home with mother   05/09/2017 Katherine Solders, MD   CNM attestation I have seen and examined this Katherine Paul and agree with above documentation in the resident's note.   Katherine Paul is a 31 y.o. [id number] s/p SVD.   Pain is well controlled.  Plan for birth control is IUD.  Method of Feeding: breast  PE:  BP 110/68 (BP Location: Left Arm)   Pulse 71   Temp 98 F (36.7 C) (Oral)   Resp 18   Ht 5\' 1"  (1.549 m)   Wt 87.5 kg (193 lb)   SpO2 100%   Breastfeeding? Unknown   BMI 36.47 kg/m  Fundus firm  No results for input(s): HGB, HCT in the last 72 hours.   Plan: discharge today - postpartum care discussed - f/u clinic in 4 weeks for postpartum visit   Katherine Paul, CNM 9:15 PM

## 2017-05-09 NOTE — Progress Notes (Signed)
CSW received consult for hx of Depression. CSW met with MOB to offer support and complete assessment.   Upon this writers arrival, MOB was warm and welcoming. CSW explained role and reasoning for visit. MOB understood and agreed to speak further. CSW used spanish interpreter. CSW inquired about hx of depression and PPD. MOB notes she experienced depression after her first pregnancy and was put on  Medication. MOB noted she currently is feeling fine and identifies that she has the support of FOB and her mother, who is coming from [place] to assist also. This Probation officer inquired if MOB was interested in starting on medication proactively. MOB noted she is ok for now.   CSW provided education regarding the baby blues period vs. perinatal mood disorders, discussed treatment and gave resources for mental health follow up if concerns arise. CSW recommends self-evaluation during the postpartum time period using the New Mom Checklist from Postpartum Progress and encouraged MOB to contact a medical professional if symptoms are noted at any time.  CSW provided review of Sudden Infant Death Syndrome (SIDS) precautions.  CSW identifies no further need for intervention and no barriers to discharge at this time.  Katherine Paul, MSW, LCSW-A Clinical Social Worker  [facility]  Office: [phone]

## 2017-05-10 ENCOUNTER — Ambulatory Visit: Payer: Self-pay

## 2017-05-10 NOTE — Lactation Note (Signed)
This note was copied from a baby's chart. Lactation Consultation Note: Interpreter Marta at the bedside. Mother reports that she is breastfeeding infant without pain to her nipples. She is aware that infant is swallowing at the breast and is seeing milk.  She is also supplementing with ebm and formula. Mother has a hand pump at the bedside. Mother advised to continue to cue base feed infant and to feed at least 8-12 times in 24 hours. Mother has understanding of all teaching. Mother informed that infant will cluster feed and this is normal newborn behavior. Discussed prevention and treatment of engorgement. Mother was advised to follow up with [facility] as needed. She has phone number.   Patient Name: Katherine Paul Today's Date: 05/10/2017 Reason for consult: Follow-up assessment   Maternal Data    Feeding    LATCH Score                   Interventions    Lactation Tools Discussed/Used     Consult Status Consult Status: Complete    Michel Bickers 05/10/2017, 11:22 AM

## 2021-12-02 ENCOUNTER — Ambulatory Visit
Admission: EM | Admit: 2021-12-02 | Discharge: 2021-12-02 | Disposition: A | Discharge status: Home / Self Care | Payer: Medicaid Other | Attending: Physician Assistant | Admitting: Physician Assistant

## 2021-12-02 ENCOUNTER — Other Ambulatory Visit: Payer: Self-pay

## 2021-12-02 DIAGNOSIS — R102 Pelvic and perineal pain: Secondary | ICD-10-CM | POA: Diagnosis not present

## 2021-12-02 LAB — POCT URINALYSIS DIP (MANUAL ENTRY)
Bilirubin, UA: NEGATIVE
Glucose, UA: NEGATIVE mg/dL
Ketones, POC UA: NEGATIVE mg/dL
Nitrite, UA: NEGATIVE
Protein Ur, POC: NEGATIVE mg/dL
Spec Grav, UA: >=1.03 — AB (ref 1.010–1.025)
Urobilinogen, UA: 0.2 E.U./dL
pH, UA: 6 (ref 5.0–8.0)

## 2021-12-02 LAB — POCT URINE PREGNANCY: Preg Test, Ur: NEGATIVE

## 2021-12-02 MED ORDER — NITROFURANTOIN MONOHYD MACRO 100 MG PO CAPS: 100.0000 mg | ORAL_CAPSULE | Freq: Two times a day (BID) | ORAL | 0 refills | Status: DC

## 2021-12-02 NOTE — ED Triage Notes (Signed)
Pt c/o periumbilical pain. Only feels pain with movement not when sitting still. Not described as stabbing pain but not achy either. Feels better in morning and gets worse throughout day. Feels relief after flatulence. States LBM Saturday morning.  ? ?Denies nausea, vomiting, diarrhea, constipation.  ? ?Onset ~ Thursday  ?

## 2021-12-02 NOTE — ED Provider Notes (Signed)
?[facility] ? ? ? ?CSN: [id number] ?Arrival date & time: 12/02/21  1140 ? ? ?  ? ?History   ?Chief Complaint ?Chief Complaint  ?Patient presents with  ? Abdominal Pain  ? ? ?HPI ?Katherine Paul is a 36 y.o. female.  ? ?Patient here today for evaluation of periumbilical pain that started a few days ago.  She reports that pain seems to be worse with movement.  She has not had any diarrhea or constipation.  Last bowel movement was day before yesterday and was normal per patient.  She has not had any nausea or vomiting.  She denies any blood in her stool.  She denies any dysuria.  She has not had fever.  She does not report treatment for symptoms. Past surgical history significant for cholecystectomy.  ? ?The history is provided by the patient.  ?Abdominal Pain ?Associated symptoms: no chills, no constipation, no diarrhea, no dysuria, no fever, no nausea and no vomiting   ? ?History reviewed. No pertinent past medical history. ? ?Patient Active Problem List  ? Diagnosis Date Noted  ? NSVD (normal spontaneous vaginal delivery) 05/07/2017  ? Major depression 10/06/2011  ? 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 CONSTIPATION 08/25/2007  ? CHOLELITHIASIS W/O CHOLECYSTITIS W/O OBST 06/15/2007  ? SYMPTOM, DYSURIA 06/07/2007  ? ABDOMINAL PAIN, EPIGASTRIC 05/18/2007  ? HEMATURIA 05/14/2007  ? HEMORRHAGE, ANTEPARTUM NOS, UNSPC EPISODE 05/14/2007  ? ? ?Past Surgical History:  ?Procedure Laterality Date  ? CHOLECYSTECTOMY, LAPAROSCOPIC  01/31/08  ? ? ?OB History   ? ? Gravida  ?3  ? Para  ?2  ? Term  ?2  ? Preterm  ?   ? AB  ?1  ? Living  ?2  ?  ? ? SAB  ?1  ? IAB  ?   ? Ectopic  ?   ? Multiple  ?0  ? Live Births  ?1  ?   ?  ?  ? ? ? ?Home Medications   ? ?Prior to Admission medications   ?Medication Sig Start Date End Date Taking? Authorizing Provider  ?nitrofurantoin, macrocrystal-monohydrate, (MACROBID) 100 MG capsule Take 1 capsule (100 mg total) by mouth 2 (two) times daily. 12/02/21  Yes  Tomi Bamberger, PA-C  ?acetaminophen (TYLENOL) 500 MG tablet Take 500 mg by mouth every 6 (six) hours as needed for mild pain.    [provider]  ?Prenatal Vit-Fe Fumarate-FA (PRENATAL MULTIVITAMIN) TABS tablet Take 1 tablet by mouth daily at 12 noon.    [provider]  ? ? ?Family History ?History reviewed. No pertinent family history. ? ?Social History ?Social History  ? ?Tobacco Use  ? Smoking status: Never  ? Smokeless tobacco: Never  ?Substance Use Topics  ? Alcohol use: No  ? Drug use: No  ? ? ? ?Allergies   ?Patient has no known allergies. ? ? ?Review of Systems ?Review of Systems  ?Constitutional:  Negative for chills and fever.  ?Eyes:  Negative for discharge and redness.  ?Gastrointestinal:  Positive for abdominal pain. Negative for blood in stool, constipation, diarrhea, nausea and vomiting.  ?Genitourinary:  Negative for dysuria.  ? ? ?Physical Exam ?Triage Vital Signs ?ED Triage Vitals  ?Enc Vitals Group  ?   BP 12/02/21 1319 118/79  ?   Pulse Rate 12/02/21 1319 65  ?   Resp 12/02/21 1319 18  ?   Temp 12/02/21 1319 98.2 ?F (36.8 ?C)  ?   Temp Source 12/02/21 1319 Oral  ?  SpO2 12/02/21 1319 97 %  ?   Weight --   ?   Height --   ?   Head Circumference --   ?   Peak Flow --   ?   Pain Score 12/02/21 1314 0  ?   Pain Loc --   ?   Pain Edu? --   ?   Excl. in GC? --   ? ?No data found. ? ?Updated Vital Signs ?BP 118/79 (BP Location: Left Arm)   Pulse 65   Temp 98.2 ?F (36.8 ?C) (Oral)   Resp 18   SpO2 97%   Breastfeeding No  ? ?Physical Exam ?Vitals and nursing note reviewed.  ?Constitutional:   ?   General: She is not in acute distress. ?   Appearance: Normal appearance. She is not ill-appearing.  ?HENT:  ?   Head: Normocephalic and atraumatic.  ?Eyes:  ?   Conjunctiva/sclera: Conjunctivae normal.  ?Cardiovascular:  ?   Rate and Rhythm: Normal rate and regular rhythm.  ?   Heart sounds: Normal heart sounds. No murmur heard. ?Pulmonary:  ?   Effort: Pulmonary effort is normal. No  respiratory distress.  ?   Breath sounds: Normal breath sounds. No wheezing, rhonchi or rales.  ?Abdominal:  ?   General: Abdomen is flat. Bowel sounds are normal. There is no distension.  ?   Palpations: Abdomen is soft.  ?   Tenderness: There is abdominal tenderness (suprapubic TTP). There is no guarding.  ?Neurological:  ?   Mental Status: She is alert.  ?Psychiatric:     ?   Mood and Affect: Mood normal.     ?   Behavior: Behavior normal.     ?   Thought Content: Thought content normal.  ? ? ? ?UC Treatments / Results  ?Labs ?(all labs ordered are listed, but only abnormal results are displayed) ?Labs Reviewed  ?POCT URINALYSIS DIP (MANUAL ENTRY) - Abnormal; Notable for the following components:  ?    Result Value  ? Clarity, UA cloudy (*)   ? Spec Grav, UA >=1.030 (*)   ? Blood, UA trace-intact (*)   ? Leukocytes, UA Trace (*)   ? All other components within normal limits  ?URINE CULTURE  ?POCT URINE PREGNANCY  ? ? ?EKG ? ? ?Radiology ?No results found. ? ?Procedures ?Procedures (including critical care time) ? ?Medications Ordered in UC ?Medications - No data to display ? ?Initial Impression / Assessment and Plan / UC Course  ?I have reviewed the triage vital signs and the nursing notes. ? ?Pertinent labs & imaging results that were available during my care of the patient were reviewed by me and considered in my medical decision making (see chart for details). ? ?  Macrobid prescribed to cover possible UTI given results of UA- Urine culture ordered. Recommended further evaluation in the ED if symptoms worsen in any way as she may need abdominal CT if symptoms persist. Patient expresses understanding.  ? ? ?Final Clinical Impressions(s) / UC Diagnoses  ? ?Final diagnoses:  ?Suprapubic pain  ? ?Discharge Instructions   ?None ?  ? ?ED Prescriptions   ? ? Medication Sig Dispense Auth. Provider  ? nitrofurantoin, macrocrystal-monohydrate, (MACROBID) 100 MG capsule Take 1 capsule (100 mg total) by mouth 2 (two)  times daily. 10 capsule Tomi Bamberger, PA-C  ? ?  ? ?PDMP not reviewed this encounter. ?  ?Tomi Bamberger, PA-C ?12/02/21 1455 ? ?

## 2021-12-03 LAB — URINE CULTURE: Culture: <10000 — AB

## 2022-04-16 ENCOUNTER — Ambulatory Visit (HOSPITAL_COMMUNITY)
Admission: EM | Admit: 2022-04-16 | Discharge: 2022-04-16 | Disposition: A | Discharge status: Home / Self Care | Payer: Medicaid Other | Attending: Family Medicine | Admitting: Family Medicine

## 2022-04-16 ENCOUNTER — Encounter (HOSPITAL_COMMUNITY): Payer: Self-pay | Admitting: *Deleted

## 2022-04-16 DIAGNOSIS — K2900 Acute gastritis without bleeding: Secondary | ICD-10-CM | POA: Diagnosis not present

## 2022-04-16 LAB — POCT URINALYSIS DIPSTICK, ED / UC
Glucose, UA: NEGATIVE mg/dL
Ketones, ur: NEGATIVE mg/dL
Leukocytes,Ua: NEGATIVE
Nitrite: NEGATIVE
Protein, ur: NEGATIVE mg/dL
Specific Gravity, Urine: >=1.03 (ref 1.005–1.030)
Urobilinogen, UA: 0.2 mg/dL (ref 0.0–1.0)
pH: 5.5 (ref 5.0–8.0)

## 2022-04-16 LAB — POC URINE PREG, ED: Preg Test, Ur: NEGATIVE

## 2022-04-16 MED ORDER — LIDOCAINE VISCOUS HCL 2 % MT SOLN
OROMUCOSAL | Status: AC
  Filled 2022-04-16: qty 15

## 2022-04-16 MED ORDER — ALUM & MAG HYDROXIDE-SIMETH 200-200-20 MG/5ML PO SUSP
ORAL | Status: AC
  Filled 2022-04-16: qty 30

## 2022-04-16 MED ORDER — LIDOCAINE VISCOUS HCL 2 % MT SOLN
15.0000 mL | Freq: Once | OROMUCOSAL | Status: AC
  Administered 2022-04-16: 15 mL via ORAL

## 2022-04-16 MED ORDER — SUCRALFATE 1 G PO TABS: 1.0000 g | ORAL_TABLET | Freq: Two times a day (BID) | ORAL | 0 refills | Status: DC

## 2022-04-16 MED ORDER — ALUM & MAG HYDROXIDE-SIMETH 200-200-20 MG/5ML PO SUSP
30.0000 mL | Freq: Once | ORAL | Status: AC
  Administered 2022-04-16: 30 mL via ORAL

## 2022-04-16 MED ORDER — OMEPRAZOLE 40 MG PO CPDR: 40.0000 mg | DELAYED_RELEASE_CAPSULE | Freq: Every day | ORAL | 1 refills | Status: DC

## 2022-04-16 NOTE — ED Triage Notes (Addendum)
Via video interpreter [phone]:  Pt c/o slight epigastric pain onset 3 days ago; states pain became more yesterday. States now pain is in epigastric region radiating to periumbilical region and into chest. Describes pain as constant throbbing pain, with varying intensity today. Pain worse with palpation, bending and standing. C/O nausea, but denies vomiting and describes good appetite. States took [employer] @ approx 1500 and has had some relief. States had similar pain approx 3 months ago along with bloating.

## 2022-04-16 NOTE — ED Provider Notes (Signed)
[facility]    CSN: [id number] Arrival date & time: 04/16/22  1644      History   Chief Complaint Chief Complaint  Patient presents with   Abdominal Pain    HPI Katherine Paul is a 36 y.o. female.    Abdominal Pain  Here for upper abdominal pain that began July 23.  At first it was fairly mild in the epigastric area and then intensified the next day.  Today it has been more pronounced and more around her umbilicus and then is radiating up into her chest.  She has been burping more.  She is nauseated.  Eating makes it worse.  No vomiting or diarrhea.  Last bowel movement was this morning.  Bowel movement was normal, and there is not been any blood in the stool.  She has had a cholecystectomy  Last menstrual cycle was July 15    History reviewed. No pertinent past medical history.  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 Authorizing Provider  omeprazole (PRILOSEC) 40 MG capsule Take 1 capsule (40 mg total) by mouth daily. 04/16/22  Yes Troy Hartzog, Janace Aris, MD  sucralfate (CARAFATE) 1 g tablet Take 1 tablet (1 g total) by mouth 2 (two) times daily. 04/16/22  Yes Cameron Schwinn, Janace Aris, MD  UNKNOWN TO  PATIENT "Med for gastritis pain from a friend"   Yes [provider]  acetaminophen (TYLENOL) 500 MG tablet Take 500 mg by mouth every 6 (six) hours as needed for mild pain.    [provider]    Family History Family History  Problem Relation Age of Onset   Healthy Mother     Social History Social History   Tobacco Use   Smoking status: Never   Smokeless tobacco: Never  Vaping Use   Vaping Use: Never used  Substance Use Topics   Alcohol use: No   Drug use: No     Allergies   Patient has no known allergies.   Review of Systems Review of Systems  Gastrointestinal:  Positive for abdominal pain.     Physical Exam Triage Vital Signs ED Triage Vitals  Enc Vitals Group     BP 04/16/22 1710 123/85     Pulse Rate 04/16/22 1710 80     Resp 04/16/22 1710 18     Temp 04/16/22 1710 97.9 F (36.6 C)     Temp Source 04/16/22 1710 Oral     SpO2 04/16/22 1710 98 %  Weight --      Height --      Head Circumference --      Peak Flow --      Pain Score 04/16/22 1711 8     Pain Loc --      Pain Edu? --      Excl. in GC? --    No data found.  Updated Vital Signs BP 123/85   Pulse 80   Temp 97.9 F (36.6 C) (Oral)   Resp 18   LMP 04/05/2022 (Exact Date)   SpO2 98%   Visual Acuity Right Eye Distance:   Left Eye Distance:   Bilateral Distance:    Right Eye Near:   Left Eye Near:    Bilateral Near:     Physical Exam Vitals and nursing note reviewed.  Constitutional:      General: She is not in acute distress.    Appearance: She is not ill-appearing, toxic-appearing or diaphoretic.  HENT:     Mouth/Throat:     Mouth: Mucous membranes are moist.  Eyes:     Extraocular Movements: Extraocular movements intact.     Pupils: Pupils are equal, round, and reactive to light.  Cardiovascular:     Rate and Rhythm: Normal rate and regular rhythm.     Heart sounds: No murmur heard. Pulmonary:     Effort: Pulmonary effort is normal.     Breath  sounds: Normal breath sounds. No stridor. No wheezing, rhonchi or rales.  Abdominal:     General: Bowel sounds are normal. There is no distension.     Palpations: Abdomen is soft. There is no mass.     Tenderness: There is abdominal tenderness (epigastric, umbilical and right periumbilica. There is no tenderness in the RLQ, suprapubic area, nor in the LLQ).  Musculoskeletal:     Cervical back: Neck supple.  Lymphadenopathy:     Cervical: No cervical adenopathy.  Skin:    Coloration: Skin is not jaundiced or pale.  Neurological:     General: No focal deficit present.     Mental Status: She is oriented to person, place, and time.  Psychiatric:        Behavior: Behavior normal.      UC Treatments / Results  Labs (all labs ordered are listed, but only abnormal results are displayed) Labs Reviewed  POCT URINALYSIS DIPSTICK, ED / UC - Abnormal; Notable for the following components:      Result Value   Bilirubin Urine SMALL (*)    Hgb urine dipstick TRACE (*)    All other components within normal limits  POC URINE PREG, ED    EKG   Radiology No results found.  Procedures Procedures (including critical care time)  Medications Ordered in UC Medications  alum & mag hydroxide-simeth (MAALOX/MYLANTA) 200-200-20 MG/5ML suspension 30 mL (30 mLs Oral Given 04/16/22 1728)    And  lidocaine (XYLOCAINE) 2 % viscous mouth solution 15 mL (15 mLs Oral Given 04/16/22 1728)    Initial Impression / Assessment and Plan / UC Course  I have reviewed the triage vital signs and the nursing notes.  Pertinent labs & imaging results that were available during my care of the patient were reviewed by me and considered in my medical decision making (see chart for details).     UPT is negative  UA shows small amount of bilirubin and trace of blood  Minutes after she had been administered a GI cocktail, her pain is improved.  She feels  a little burning now at the epigastric area but the pain she  was having is definitely better.  I will send in PPI and Carafate, and she will follow-up with her primary care   Final Clinical Impressions(s) / UC Diagnoses   Final diagnoses:  Acute gastritis without hemorrhage, unspecified gastritis type     Discharge Instructions      The pregnancy test was negative(la prueba de embarazo fue negativa)  The urinalysis was negative(la prueba de orina no enseno problemas)  Take omeprazole 40 mg--1 capsule daily for stomach acid(tome 1 capsula diaria para bajar la cantidad de acido del estomago que Ud esta haciendo)  Take sucralfate 1 g--1 tablet 2 times daily with breakfast and supper; this is to coat the stomach(tome 1 tableta 2 veces al dia con desayuno y con la cena; es para cubrir adentro del Manufacturing systems engineer)  See your regular doctor in follow-up about this problem in the next 1 to 2 weeks(vaya a consultar con se medico en 1-2 semanas)     ED Prescriptions     Medication Sig Dispense Auth. Provider   omeprazole (PRILOSEC) 40 MG capsule Take 1 capsule (40 mg total) by mouth daily. 30 capsule Zenia Resides, MD   sucralfate (CARAFATE) 1 g tablet Take 1 tablet (1 g total) by mouth 2 (two) times daily. 60 tablet Cray Monnin, Janace Aris, MD      PDMP not reviewed this encounter.   Zenia Resides, MD 04/16/22 [phone]

## 2022-04-16 NOTE — Discharge Instructions (Addendum)
The pregnancy test was negative(la prueba de embarazo fue negativa)  The urinalysis was negative(la prueba de orina no enseno problemas)  Take omeprazole 40 mg--1 capsule daily for stomach acid(tome 1 capsula diaria para bajar la cantidad de acido del estomago que Ud esta haciendo)  Take sucralfate 1 g--1 tablet 2 times daily with breakfast and supper; this is to coat the stomach(tome 1 tableta 2 veces al dia con desayuno y con la cena; es para cubrir adentro del Manufacturing systems engineer)  See your regular doctor in follow-up about this problem in the next 1 to 2 weeks(vaya a consultar con se medico en 1-2 semanas)

## 2022-06-23 ENCOUNTER — Encounter (HOSPITAL_COMMUNITY): Payer: Self-pay | Admitting: *Deleted

## 2022-06-23 ENCOUNTER — Other Ambulatory Visit: Payer: Self-pay

## 2022-06-23 ENCOUNTER — Ambulatory Visit (HOSPITAL_COMMUNITY)
Admission: EM | Admit: 2022-06-23 | Discharge: 2022-06-23 | Disposition: A | Discharge status: Home / Self Care | Payer: Medicaid Other | Attending: Internal Medicine | Admitting: Internal Medicine

## 2022-06-23 ENCOUNTER — Ambulatory Visit (INDEPENDENT_AMBULATORY_CARE_PROVIDER_SITE_OTHER): Payer: Medicaid Other

## 2022-06-23 DIAGNOSIS — W19XXXA Unspecified fall, initial encounter: Secondary | ICD-10-CM

## 2022-06-23 DIAGNOSIS — M25561 Pain in right knee: Secondary | ICD-10-CM

## 2022-06-23 NOTE — ED Provider Notes (Signed)
[facility]    CSN: [id number] Arrival date & time: 06/23/22  1720      History   Chief Complaint Chief Complaint  Patient presents with   Fall    HPI Katherine Paul is a 36 y.o. female.   Pt complains of she fell while turning about 5 days ago.  Reports hearing a pop at that time.  She reports over the last day or so she has developed bruising to the knee.  She reports pain is worse with bearing weight.  She has tried ice with minimal relief.  No prior knee problems.      History reviewed. No pertinent past medical history.  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 Authorizing Provider  acetaminophen (TYLENOL) 500 MG tablet Take 500 mg by mouth every 6 (six) hours as needed for mild pain.    [provider]  omeprazole (PRILOSEC) 40 MG capsule Take 1 capsule (40 mg total) by mouth daily. 04/16/22   Zenia Resides, MD  sucralfate (CARAFATE) 1 g tablet Take 1 tablet (1 g total) by mouth 2 (two) times daily. 04/16/22   Zenia Resides, MD  UNKNOWN TO PATIENT "Med for gastritis pain from a friend"    [provider]    Family History Family History  Problem Relation Age of Onset    Healthy Mother     Social History Social History   Tobacco Use   Smoking status: Never   Smokeless tobacco: Never  Vaping Use   Vaping Use: Never used  Substance Use Topics   Alcohol use: No   Drug use: No     Allergies   Patient has no known allergies.   Review of Systems Review of Systems  Constitutional:  Negative for chills and fever.  HENT:  Negative for ear pain and sore throat.   Eyes:  Negative for pain and visual disturbance.  Respiratory:  Negative for cough and shortness of breath.   Cardiovascular:  Negative for chest pain and palpitations.  Gastrointestinal:  Negative for abdominal pain and vomiting.  Genitourinary:  Negative for dysuria and hematuria.  Musculoskeletal:  Positive for arthralgias (right knee pain). Negative for back pain.  Skin:  Positive for color change (right knee brusing). Negative for rash.  Neurological:  Negative for seizures and syncope.  All other systems reviewed and are negative.    Physical Exam Triage Vital Signs ED Triage Vitals  Enc Vitals Group     BP  06/23/22 1754 121/77     Pulse Rate 06/23/22 1754 73     Resp 06/23/22 1754 18     Temp 06/23/22 1754 100 F (37.8 C)     Temp src --      SpO2 06/23/22 1754 97 %     Weight --      Height --      Head Circumference --      Peak Flow --      Pain Score 06/23/22 1750 9     Pain Loc --      Pain Edu? --      Excl. in GC? --    No data found.  Updated Vital Signs BP 121/77   Pulse 73   Temp 100 F (37.8 C)   Resp 18   LMP 06/06/2022 (Approximate)   SpO2 97%   Visual Acuity Right Eye Distance:   Left Eye Distance:   Bilateral Distance:    Right Eye Near:   Left Eye Near:    Bilateral Near:     Physical Exam Vitals and nursing note reviewed.  Constitutional:      General: She is not in acute distress.    Appearance: She is well-developed.  HENT:     Head: Normocephalic and atraumatic.  Eyes:     Conjunctiva/sclera: Conjunctivae normal.   Cardiovascular:     Rate and Rhythm: Normal rate and regular rhythm.     Heart sounds: No murmur heard. Pulmonary:     Effort: Pulmonary effort is normal. No respiratory distress.     Breath sounds: Normal breath sounds.  Abdominal:     Palpations: Abdomen is soft.     Tenderness: There is no abdominal tenderness.  Musculoskeletal:        General: No swelling.     Cervical back: Neck supple.     Right knee: Ecchymosis and bony tenderness present. No swelling. No LCL laxity, MCL laxity or ACL laxity. Normal pulse.  Skin:    General: Skin is warm and dry.     Capillary Refill: Capillary refill takes less than 2 seconds.  Neurological:     Mental Status: She is alert.  Psychiatric:        Mood and Affect: Mood normal.      UC Treatments / Results  Labs (all labs ordered are listed, but only abnormal results are displayed) Labs Reviewed - No data to display  EKG   Radiology DG Knee Complete 4 Views Right  Result Date: 06/23/2022 CLINICAL DATA:  Right knee pain after fall several days ago. EXAM: RIGHT KNEE - COMPLETE 4+ VIEW COMPARISON:  November 24, 2010. FINDINGS: No evidence of fracture, dislocation, or joint effusion. No evidence of arthropathy or other focal bone abnormality. Soft tissues are unremarkable. IMPRESSION: Negative. Electronically Signed   By: Lupita Raider M.D.   On: 06/23/2022 18:29    Procedures Procedures (including critical care time)  Medications Ordered in UC Medications - No data to display  Initial Impression / Assessment and Plan / UC Course  I have reviewed the triage vital signs and the nursing notes.  Pertinent labs & imaging results that were available during my care of the patient were reviewed by me and considered in my medical decision making (see chart for details).     Right knee injury. Imaging negative.  No joint laxity on exam.  Knee sleeve given today which pt reports has provided some relief.  Advised rest, ice, ibuprofen as  needed.  If no improvement advised follow up with ortho for further evaluation.  Final Clinical Impressions(s) / UC Diagnoses   Final diagnoses:  Acute pain of right knee     Discharge Instructions      Your xray is negative for fracture.   Recommend ice to the knee, ibuprofen as needed, and rest.  Can wear knee sleeve Follow up with orthopedics.      ED Prescriptions   None    PDMP not reviewed this encounter.   Ward, Lenise Arena, PA-C 06/24/22 2014

## 2022-06-23 NOTE — ED Triage Notes (Addendum)
Pt reports she fell out side on Thursday while turning Pt heard her RT knee pop and she fell. Pt reports her husband straightened the Rt knee out and Pt heard the pop again. PT reports today the RT knee is purple

## 2022-06-23 NOTE — Discharge Instructions (Signed)
Your xray is negative for fracture.   Recommend ice to the knee, ibuprofen as needed, and rest.  Can wear knee sleeve Follow up with orthopedics.

## 2022-09-20 ENCOUNTER — Inpatient Hospital Stay (HOSPITAL_COMMUNITY): Payer: Medicaid Other

## 2022-09-20 ENCOUNTER — Encounter (HOSPITAL_COMMUNITY): Payer: Self-pay | Admitting: Obstetrics and Gynecology

## 2022-09-20 ENCOUNTER — Encounter (HOSPITAL_COMMUNITY): Payer: Self-pay

## 2022-09-20 ENCOUNTER — Ambulatory Visit (HOSPITAL_COMMUNITY)
Admission: EM | Admit: 2022-09-20 | Discharge: 2022-09-20 | Disposition: A | Discharge status: Home / Self Care | Payer: Medicaid Other | Attending: Emergency Medicine | Admitting: Emergency Medicine

## 2022-09-20 ENCOUNTER — Inpatient Hospital Stay (HOSPITAL_COMMUNITY)
Admission: AD | Admit: 2022-09-20 | Discharge: 2022-09-20 | Disposition: A | Discharge status: Home / Self Care | Payer: Medicaid Other | Attending: Obstetrics and Gynecology | Admitting: Obstetrics and Gynecology

## 2022-09-20 ENCOUNTER — Other Ambulatory Visit: Payer: Self-pay

## 2022-09-20 DIAGNOSIS — O209 Hemorrhage in early pregnancy, unspecified: Secondary | ICD-10-CM | POA: Diagnosis not present

## 2022-09-20 DIAGNOSIS — R1030 Lower abdominal pain, unspecified: Secondary | ICD-10-CM | POA: Diagnosis not present

## 2022-09-20 DIAGNOSIS — O09521 Supervision of elderly multigravida, first trimester: Secondary | ICD-10-CM | POA: Insufficient documentation

## 2022-09-20 DIAGNOSIS — O26891 Other specified pregnancy related conditions, first trimester: Secondary | ICD-10-CM | POA: Insufficient documentation

## 2022-09-20 DIAGNOSIS — Z3491 Encounter for supervision of normal pregnancy, unspecified, first trimester: Secondary | ICD-10-CM

## 2022-09-20 DIAGNOSIS — O3680X0 Pregnancy with inconclusive fetal viability, not applicable or unspecified: Secondary | ICD-10-CM

## 2022-09-20 DIAGNOSIS — Z3A01 Less than 8 weeks gestation of pregnancy: Secondary | ICD-10-CM | POA: Diagnosis not present

## 2022-09-20 DIAGNOSIS — Z3202 Encounter for pregnancy test, result negative: Secondary | ICD-10-CM

## 2022-09-20 LAB — HCG, QUANTITATIVE, PREGNANCY: hCG, Beta Chain, Quant, S: 13264 m[IU]/mL — ABNORMAL HIGH (ref <5)

## 2022-09-20 LAB — CBC
HCT: 36.5 % (ref 36.0–46.0)
Hemoglobin: 12 g/dL (ref 12.0–15.0)
MCH: 30.4 pg (ref 26.0–34.0)
MCHC: 32.9 g/dL (ref 30.0–36.0)
MCV: 92.4 fL (ref 80.0–100.0)
Platelets: 211 10*3/uL (ref 150–400)
RBC: 3.95 MIL/uL (ref 3.87–5.11)
RDW: 13.2 % (ref 11.5–15.5)
WBC: 6.3 10*3/uL (ref 4.0–10.5)
nRBC: 0 % (ref 0.0–0.2)

## 2022-09-20 LAB — WET PREP, GENITAL
Clue Cells Wet Prep HPF POC: NONE SEEN
Sperm: NONE SEEN
Trich, Wet Prep: NONE SEEN
WBC, Wet Prep HPF POC: <10 (ref <10)
Yeast Wet Prep HPF POC: NONE SEEN

## 2022-09-20 LAB — POC URINE PREG, ED: Preg Test, Ur: POSITIVE — AB

## 2022-09-20 NOTE — ED Provider Notes (Signed)
Seen in triage by this provider Medical interpreter used for this encounter Patient presents with positive home pregnancy test a few weeks ago.  She reports today she started having bright red vaginal bleeding and lower abdominal cramping. Urine pregnancy in clinic today resulted positive. Discussed with patient she needs to be evaluated in the [facility] and blood work.  Provided map to MAU entrance. Discharged via POV.   Marlow Baars, [place] 09/20/22 1148

## 2022-09-20 NOTE — MAU Provider Note (Signed)
History     CSN: [id number]  Arrival date and time: 09/20/22 1202   Event Date/Time   First Provider Initiated Contact with Patient 09/20/22 1436      Chief Complaint  Patient presents with   Abdominal Pain   Vaginal Bleeding   HPI  Katherine Paul is a 36 y.o. [id number] at approximately 6 weeks who presents for evaluation of vaginal bleeding. Patient reports she is seeing spotting when she wipes today. She reports it is pink in color and less than a period. She also reports lower abdominal cramping. Patient rates the pain as a 0/10 and has not tried anything for the pain. She denies any discharge and leaking of fluid. Denies any constipation, diarrhea or any urinary complaints.   OB History     Gravida  4   Para  2   Term  2   Preterm      AB  1   Living  2      SAB  1   IAB      Ectopic      Multiple  0   Live Births  1           No past medical history on file.  Past Surgical History:  Procedure Laterality Date   CHOLECYSTECTOMY, LAPAROSCOPIC  01/31/08    Family History  Problem Relation Age of Onset   Healthy Mother     Social History   Tobacco Use   Smoking status: Never   Smokeless tobacco: Never  Vaping Use   Vaping Use: Never used  Substance Use Topics   Alcohol use: No   Drug use: No    Allergies: No Known Allergies  No medications prior to admission.    Review of Systems  Constitutional: Negative.  Negative for fatigue and fever.  HENT: Negative.    Respiratory: Negative.  Negative for shortness of breath.   Cardiovascular: Negative.  Negative for chest pain.  Gastrointestinal:  Positive for abdominal pain. Negative for constipation, diarrhea, nausea and vomiting.  Genitourinary:  Positive for vaginal bleeding. Negative for dysuria.  Neurological: Negative.  Negative for dizziness and headaches.   Physical Exam   Blood pressure 129/76, pulse 75, temperature 98.6 F (37 C), temperature source Oral, resp. rate 18, height  5' (1.524 m), weight 86.2 kg, last menstrual period 07/24/2022, SpO2 99 %.  Patient Vitals for the past 24 hrs:  BP Temp Temp src Pulse Resp SpO2 Height Weight  09/20/22 1302 129/76 98.6 F (37 C) Oral 75 18 99 % -- --  09/20/22 1257 -- -- -- -- -- -- 5' (1.524 m) 86.2 kg    Physical Exam Vitals and nursing note reviewed.  Constitutional:      General: She is not in acute distress.    Appearance: She is well-developed.  HENT:     Head: Normocephalic.  Eyes:     Pupils: Pupils are equal, round, and reactive to light.  Cardiovascular:     Rate and Rhythm: Normal rate and regular rhythm.     Heart sounds: Normal heart sounds.  Pulmonary:     Effort: Pulmonary effort is normal. No respiratory distress.     Breath sounds: Normal breath sounds.  Abdominal:     General: Bowel sounds are normal. There is no distension.     Palpations: Abdomen is soft.     Tenderness: There is no abdominal tenderness.  Skin:    General: Skin is warm and dry.  Neurological:  Mental Status: She is alert and oriented to person, place, and time.  Psychiatric:        Mood and Affect: Mood normal.        Behavior: Behavior normal.        Thought Content: Thought content normal.        Judgment: Judgment normal.     MAU Course  Procedures  Results for orders placed or performed during the hospital encounter of 09/20/22 (from the past 24 hour(s))  CBC     Status: None   Collection Time: 09/20/22 12:12 PM  Result Value Ref Range   WBC 6.3 4.0 - 10.5 K/uL   RBC 3.95 3.87 - 5.11 MIL/uL   Hemoglobin 12.0 12.0 - 15.0 g/dL   HCT 60.1 09.3 - 23.5 %   MCV 92.4 80.0 - 100.0 fL   MCH 30.4 26.0 - 34.0 pg   MCHC 32.9 30.0 - 36.0 g/dL   RDW 57.3 22.0 - 25.4 %   Platelets 211 150 - 400 K/uL   nRBC 0.0 0.0 - 0.2 %  hCG, quantitative, pregnancy     Status: Abnormal   Collection Time: 09/20/22 12:12 PM  Result Value Ref Range   hCG, Beta Chain, Quant, S 13,264 (H) <5 mIU/mL  Wet prep, genital      Status: None   Collection Time: 09/20/22  1:12 PM  Result Value Ref Range   Yeast Wet Prep HPF POC NONE SEEN NONE SEEN   Trich, Wet Prep NONE SEEN NONE SEEN   Clue Cells Wet Prep HPF POC NONE SEEN NONE SEEN   WBC, Wet Prep HPF POC <10 <10   Sperm NONE SEEN      US OB LESS THAN 14 WEEKS WITH OB TRANSVAGINAL  Result Date: 09/20/2022 CLINICAL DATA:  Vaginal bleeding in pregnancy EXAM: OBSTETRIC <14 WK [place] AND TRANSVAGINAL OB US TECHNIQUE: Both transabdominal and transvaginal ultrasound examinations were performed for complete evaluation of the gestation as well as the maternal uterus, adnexal regions, and pelvic cul-de-sac. Transvaginal technique was performed to assess early pregnancy. COMPARISON:  None Available. FINDINGS: Intrauterine gestational sac: Single Yolk sac:  Visualized. Embryo:  Visualized. Cardiac Activity: Not Visualized. Heart Rate: 0 bpm CRL:  6.0 mm   6 w   3 d Subchorionic hemorrhage:  None visualized. Maternal uterus/adnexae: Retroverted uterus. Bilateral ovaries within normal limits with right ovarian corpus luteal cyst. No free fluid within the pelvis. IMPRESSION: Intrauterine gestational sac containing embryo measuring 6 mm by crown-rump length. No cardiac activity. Findings are suspicious but not yet definitive for failed pregnancy. Recommend follow-up US in 10-14 days for definitive diagnosis. This recommendation follows SRU consensus guidelines: Diagnostic Criteria for Nonviable Pregnancy Early in the First Trimester. Malva Limes Med 2013; 270:6237-62. Electronically Signed   By: Duanne Guess D.O.   On: 09/20/2022 14:32     MDM Labs ordered and reviewed.   UA, UPT CBC, HCG ABO/Rh- O Pos Wet prep and gc/chlamydia US OB Comp Less 14 weeks with Transvaginal  CNM independently reviewed the imaging ordered. Imaging show IUP with no cardiac activity yet but does not meet criteria for failed pregnancy yet.   CNM reviewed results and recommendations for repeat ultrasound in  10-14 days.  Assessment and Plan   1. Normal intrauterine pregnancy on prenatal ultrasound in first trimester   2. Pregnancy with uncertain fetal viability, single or unspecified fetus   3. [redacted] weeks gestation of pregnancy     -Discharge home in stable condition -  Vaginal bleeding precautions discussed -Patient advised to follow-up with [facility] for [facility] ultrasound  -Patient may return to MAU as needed or if her condition were to change or worsen  Rolm Bookbinder, CNM 09/20/2022, 2:37 PM

## 2022-09-20 NOTE — MAU Note (Signed)
Katherine Paul is a 36 y.o. at Unknown here in MAU reporting: she's having spotting with wiping and lower abdominal pain that began today.  Reports last intercourse 1 week ago. LMP: 07/24/2022 Onset of complaint: today Pain score: 0 Vitals:   09/20/22 1302  BP: 129/76  Pulse: 75  Resp: 18  Temp: 98.6 F (37 C)  SpO2: 99%     FHT:NA Lab orders placed from triage:

## 2022-09-20 NOTE — ED Triage Notes (Signed)
Pt reports she is [redacted] weeks pregnant,started with light bleeding and lower abdominal pain.

## 2022-09-22 ENCOUNTER — Other Ambulatory Visit: Payer: Self-pay

## 2022-09-22 ENCOUNTER — Inpatient Hospital Stay (HOSPITAL_COMMUNITY)
Admission: AD | Admit: 2022-09-22 | Discharge: 2022-09-22 | Disposition: A | Discharge status: Home / Self Care | Payer: Medicaid Other | Attending: Obstetrics and Gynecology | Admitting: Obstetrics and Gynecology

## 2022-09-22 ENCOUNTER — Inpatient Hospital Stay (HOSPITAL_COMMUNITY): Payer: Medicaid Other

## 2022-09-22 ENCOUNTER — Encounter (HOSPITAL_COMMUNITY): Payer: Self-pay | Admitting: Obstetrics and Gynecology

## 2022-09-22 DIAGNOSIS — O3680X0 Pregnancy with inconclusive fetal viability, not applicable or unspecified: Secondary | ICD-10-CM | POA: Diagnosis not present

## 2022-09-22 DIAGNOSIS — O26891 Other specified pregnancy related conditions, first trimester: Secondary | ICD-10-CM | POA: Diagnosis present

## 2022-09-22 DIAGNOSIS — O209 Hemorrhage in early pregnancy, unspecified: Secondary | ICD-10-CM | POA: Diagnosis not present

## 2022-09-22 DIAGNOSIS — Z9049 Acquired absence of other specified parts of digestive tract: Secondary | ICD-10-CM | POA: Insufficient documentation

## 2022-09-22 DIAGNOSIS — O09521 Supervision of elderly multigravida, first trimester: Secondary | ICD-10-CM | POA: Insufficient documentation

## 2022-09-22 DIAGNOSIS — Z79899 Other long term (current) drug therapy: Secondary | ICD-10-CM | POA: Insufficient documentation

## 2022-09-22 DIAGNOSIS — Z3A08 8 weeks gestation of pregnancy: Secondary | ICD-10-CM | POA: Insufficient documentation

## 2022-09-22 DIAGNOSIS — Z3A00 Weeks of gestation of pregnancy not specified: Secondary | ICD-10-CM

## 2022-09-22 DIAGNOSIS — Z91148 Patient's other noncompliance with medication regimen for other reason: Secondary | ICD-10-CM | POA: Insufficient documentation

## 2022-09-22 DIAGNOSIS — R109 Unspecified abdominal pain: Secondary | ICD-10-CM | POA: Insufficient documentation

## 2022-09-22 DIAGNOSIS — Z789 Other specified health status: Secondary | ICD-10-CM

## 2022-09-22 LAB — URINALYSIS, ROUTINE W REFLEX MICROSCOPIC
Bilirubin Urine: NEGATIVE
Glucose, UA: NEGATIVE mg/dL
Ketones, ur: NEGATIVE mg/dL
Leukocytes,Ua: NEGATIVE
Nitrite: NEGATIVE
Protein, ur: NEGATIVE mg/dL
Specific Gravity, Urine: 1.024 (ref 1.005–1.030)
pH: 6 (ref 5.0–8.0)

## 2022-09-22 LAB — CBC
HCT: 37.1 % (ref 36.0–46.0)
Hemoglobin: 12.5 g/dL (ref 12.0–15.0)
MCH: 30.7 pg (ref 26.0–34.0)
MCHC: 33.7 g/dL (ref 30.0–36.0)
MCV: 91.2 fL (ref 80.0–100.0)
Platelets: 226 10*3/uL (ref 150–400)
RBC: 4.07 MIL/uL (ref 3.87–5.11)
RDW: 13.1 % (ref 11.5–15.5)
WBC: 7.3 10*3/uL (ref 4.0–10.5)
nRBC: 0 % (ref 0.0–0.2)

## 2022-09-22 LAB — HCG, QUANTITATIVE, PREGNANCY: hCG, Beta Chain, Quant, S: 11815 m[IU]/mL — ABNORMAL HIGH (ref <5)

## 2022-09-22 NOTE — MAU Provider Note (Signed)
History     CSN: [id number]  Arrival date and time: 09/22/22 1752   Event Date/Time   First Provider Initiated Contact with Patient 09/22/22 1923      Chief Complaint  Patient presents with   Abdominal Pain   Vaginal Bleeding   HPI Katherine Paul is a 37 y.o. [id number] at [redacted]w[redacted]d by LMP who presents to MAU with chief complaint of abdominal pain and vaginal bleeding "like a period".  Patient was evaluated in MAU on 09/20/2022 for spotting. Since that encounter her bleeding has become heavier "like my period".   Patient's abdominal pain is 5/10. She denies aggravating or alleviating factors. She has not taken medication for this complaint.  OB History     Gravida  4   Para  2   Term  2   Preterm      AB  1   Living  2      SAB  1   IAB      Ectopic      Multiple  0   Live Births  1           History reviewed. No pertinent past medical history.  Past Surgical History:  Procedure Laterality Date   CHOLECYSTECTOMY, LAPAROSCOPIC  01/31/08    Family History  Problem Relation Age of Onset   Healthy Mother     Social History   Tobacco Use   Smoking status: Never   Smokeless tobacco: Never  Vaping Use   Vaping Use: Never used  Substance Use Topics   Alcohol use: No   Drug use: No    Allergies: No Known Allergies  Medications Prior to Admission  Medication Sig Dispense Refill Last Dose   acetaminophen (TYLENOL) 500 MG tablet Take 500 mg by mouth every 6 (six) hours as needed for mild pain.      omeprazole (PRILOSEC) 40 MG capsule Take 1 capsule (40 mg total) by mouth daily. 30 capsule 1    sucralfate (CARAFATE) 1 g tablet Take 1 tablet (1 g total) by mouth 2 (two) times daily. 60 tablet 0    UNKNOWN TO PATIENT "Med for gastritis pain from a friend"       Review of Systems  Gastrointestinal:  Positive for abdominal pain.  Genitourinary:  Positive for vaginal bleeding.  All other systems reviewed and are negative.  Physical Exam   Blood pressure  117/64, pulse 69, temperature 98.9 F (37.2 C), temperature source Oral, resp. rate 16, height 5' (1.524 m), weight 86.3 kg, last menstrual period 07/24/2022, SpO2 98 %.  Physical Exam Vitals and nursing note reviewed. Exam conducted with a chaperone present.  Constitutional:      Appearance: She is well-developed. She is not ill-appearing.  Cardiovascular:     Rate and Rhythm: Normal rate.     Heart sounds: Normal heart sounds.  Pulmonary:     Effort: Pulmonary effort is normal.     Breath sounds: Normal breath sounds.  Abdominal:     General: Abdomen is flat.     Tenderness: There is no abdominal tenderness.  Neurological:     Mental Status: She is alert and oriented to person, place, and time.  Psychiatric:        Mood and Affect: Mood normal.        Behavior: Behavior normal.     MAU Course  Procedures  MDM --Lengthy discussion with patient and her partner regarding label of "suspicous for pregnancy failure" and timeline for repeat ultrasound --  Patient's viability scan ordered 09/20/2022. Apology offered to patient; office has been closed and likely hasn't had a chance to see the order.   Orders Placed This Encounter  Procedures   US OB Transvaginal   Urinalysis, Routine w reflex microscopic Urine, Clean Catch   CBC   hCG, quantitative, pregnancy   Discharge patient   Patient Vitals for the past 24 hrs:  BP Temp Temp src Pulse Resp SpO2 Height Weight  09/22/22 1950 (!) 120/59 -- -- 78 -- -- -- --  09/22/22 1805 117/64 98.9 F (37.2 C) Oral 69 16 98 % -- --  09/22/22 1800 -- -- -- -- -- -- 5' (1.524 m) 86.3 kg   Results for orders placed or performed during the hospital encounter of 09/22/22 (from the past 24 hour(s))  Urinalysis, Routine w reflex microscopic Urine, Clean Catch     Status: Abnormal   Collection Time: 09/22/22  6:32 PM  Result Value Ref Range   Color, Urine YELLOW YELLOW   APPearance HAZY (A) CLEAR   Specific Gravity, Urine 1.024 1.005 - 1.030    pH 6.0 5.0 - 8.0   Glucose, UA NEGATIVE NEGATIVE mg/dL   Hgb urine dipstick LARGE (A) NEGATIVE   Bilirubin Urine NEGATIVE NEGATIVE   Ketones, ur NEGATIVE NEGATIVE mg/dL   Protein, ur NEGATIVE NEGATIVE mg/dL   Nitrite NEGATIVE NEGATIVE   Leukocytes,Ua NEGATIVE NEGATIVE   RBC / HPF 11-20 0 - 5 RBC/hpf   WBC, UA 0-5 0 - 5 WBC/hpf   Bacteria, UA RARE (A) NONE SEEN   Squamous Epithelial / LPF 0-5 0 - 5 /HPF   Mucus PRESENT   CBC     Status: None   Collection Time: 09/22/22  6:39 PM  Result Value Ref Range   WBC 7.3 4.0 - 10.5 K/uL   RBC 4.07 3.87 - 5.11 MIL/uL   Hemoglobin 12.5 12.0 - 15.0 g/dL   HCT 16.1 09.6 - 04.5 %   MCV 91.2 80.0 - 100.0 fL   MCH 30.7 26.0 - 34.0 pg   MCHC 33.7 30.0 - 36.0 g/dL   RDW 40.9 81.1 - 91.4 %   Platelets 226 150 - 400 K/uL   nRBC 0.0 0.0 - 0.2 %  hCG, quantitative, pregnancy     Status: Abnormal   Collection Time: 09/22/22  6:39 PM  Result Value Ref Range   hCG, Beta Chain, Quant, S 11,815 (H) <5 mIU/mL   US OB Transvaginal  Result Date: 09/22/2022 CLINICAL DATA:  heavy bleeding "like a period" EXAM: TRANSVAGINAL OB ULTRASOUND TECHNIQUE: Transvaginal ultrasound was performed for complete evaluation of the gestation as well as the maternal uterus, adnexal regions, and pelvic cul-de-sac. COMPARISON:  None Available. FINDINGS: Intrauterine gestational sac: Single Yolk sac:  Visualized. Embryo:  Visualized. Cardiac Activity: Not present CRL: 0.6 cm = 6 weeks 2 days [place] EDC: 05/16/2023 Subchorionic hemorrhage:  None visualized. Adnexa: No masses or fluid collections. IMPRESSION: Six week 2 day intrauterine pregnancy with no fetal heart motion. No adnexal pathology Electronically Signed   By: Layla Maw M.D.   On: 09/22/2022 19:19    Assessment and Plan  --37 y.o. [id number]  --Ultrasound essentially unchanged from previous on 09/20/22 --VSS, Hgb stable --Findings suspicious for pregnancy failure --Rh POS --Language barrier: spanish-language interpreter  present for all interaction --Discharge home in stable condition with bleeding precautions  F/U: --Order previously placed for viability ultrasound at [facility] --Patient advised to reach out to office if she hasn't heard from them by  Wednesday 01/03  Darlina Rumpf, [place], MSN, CNM 09/22/2022, 9:20 PM

## 2022-09-22 NOTE — MAU Note (Signed)
[place] is a 37 y.o. at [redacted]w[redacted]d here in MAU reporting: bleeding has gotten heavier and pain is getting worse since previous MAU visit. Is wearing pads and is on her third pad for today. Seeing some stringy clots and saw one that was a ball that she poked with a stick and it dissolved.   Onset of complaint: ongoing but worse  Pain score: 5/10  Vitals:   09/22/22 1805  BP: 117/64  Pulse: 69  Resp: 16  Temp: 98.9 F (37.2 C)  SpO2: 98%     FHT:NA  Lab orders placed from triage: UA

## 2022-09-23 LAB — GC/CHLAMYDIA PROBE AMP (~~LOC~~) NOT AT ARMC
Chlamydia: NEGATIVE
Comment: NEGATIVE
Comment: NORMAL
Neisseria Gonorrhea: NEGATIVE

## 2022-09-24 ENCOUNTER — Telehealth: Payer: Self-pay | Admitting: Family Medicine

## 2022-09-24 NOTE — Telephone Encounter (Signed)
I reviewed patient chart. I called Katherine Paul with [employer] and informed her that her [place] appointment on 10/07/21 is the soonest we can do the [place] and if in the meantime she develops severe pain or heavy bleeding to go back to the hospital for evaluation. She voices understanding. Staci Acosta

## 2022-09-24 NOTE — Telephone Encounter (Signed)
Patient called in with the help of Eda, she is stating MAU told her to call us if she has not heard from [place] by 1/3 about her ultrasound. When told the date and time she expressed that she needed something sooner and we do not have any availability. Patient was told a message will be sent to the nurse pool to try to find her an alternative date.

## 2022-09-26 ENCOUNTER — Inpatient Hospital Stay (HOSPITAL_COMMUNITY): Payer: Medicaid Other

## 2022-09-26 ENCOUNTER — Inpatient Hospital Stay (HOSPITAL_COMMUNITY)
Admission: AD | Admit: 2022-09-26 | Discharge: 2022-09-26 | Disposition: A | Discharge status: Home / Self Care | Payer: Medicaid Other | Attending: Obstetrics and Gynecology | Admitting: Obstetrics and Gynecology

## 2022-09-26 DIAGNOSIS — O09521 Supervision of elderly multigravida, first trimester: Secondary | ICD-10-CM | POA: Diagnosis not present

## 2022-09-26 DIAGNOSIS — Z331 Pregnant state, incidental: Secondary | ICD-10-CM

## 2022-09-26 DIAGNOSIS — Z3A01 Less than 8 weeks gestation of pregnancy: Secondary | ICD-10-CM

## 2022-09-26 DIAGNOSIS — O200 Threatened abortion: Secondary | ICD-10-CM

## 2022-09-26 NOTE — MAU Provider Note (Signed)
History     CSN: [id number]  Arrival date and time: 09/26/22 1945   Event Date/Time   First Provider Initiated Contact with Patient 09/26/22 2201      Chief Complaint  Patient presents with   preg confirmed   HPI  Kayci Belleville is a 37 y.o. H4V4259 at [redacted]w[redacted]d who presents for evaluation of ***. Patient reports ***   Patient rates the pain as a ***/10 and {Has/has not:18111} tried {anything/nothing} for the pain.   She denies any {OBcomplaints:27511}. Denies any constipation, diarrhea or any urinary complaints. Reports normal fetal movement.   {GYN/OB DG:3875643}  No past medical history on file.  Past Surgical History:  Procedure Laterality Date   CHOLECYSTECTOMY, LAPAROSCOPIC  01/31/08    Family History  Problem Relation Age of Onset   Healthy Mother     Social History   Tobacco Use   Smoking status: Never   Smokeless tobacco: Never  Vaping Use   Vaping Use: Never used  Substance Use Topics   Alcohol use: No   Drug use: No    Allergies: No Known Allergies  Medications Prior to Admission  Medication Sig Dispense Refill Last Dose   acetaminophen (TYLENOL) 500 MG tablet Take 500 mg by mouth every 6 (six) hours as needed for mild pain.      omeprazole (PRILOSEC) 40 MG capsule Take 1 capsule (40 mg total) by mouth daily. 30 capsule 1    sucralfate (CARAFATE) 1 g tablet Take 1 tablet (1 g total) by mouth 2 (two) times daily. 60 tablet 0    UNKNOWN TO PATIENT "Med for gastritis pain from a friend"       Review of Systems Physical Exam   Blood pressure 127/61, pulse 75, temperature 97.9 F (36.6 C), temperature source Oral, resp. rate 16, height 5' (1.524 m), weight 86.4 kg, last menstrual period 07/24/2022.  Patient Vitals for the past 24 hrs:  BP Temp Temp src Pulse Resp Height Weight  09/26/22 1955 127/61 97.9 F (36.6 C) Oral 75 16 5' (1.524 m) 86.4 kg    Physical Exam  Fetal Tracing:  Baseline: Variability: Accels: Decels:  Toco:      MAU  Course  Procedures  No results found for this or any previous visit (from the past 24 hour(s)).   US OB Transvaginal  Result Date: 09/26/2022 CLINICAL DATA:  Vaginal bleeding. EXAM: OBSTETRIC <14 WK [place] AND TRANSVAGINAL OB US TECHNIQUE: Both transabdominal and transvaginal ultrasound examinations were performed for complete evaluation of the gestation as well as the maternal uterus, adnexal regions, and pelvic cul-de-sac. Transvaginal technique was performed to assess early pregnancy. COMPARISON:  None Available. FINDINGS: Intrauterine gestational sac: Single Yolk sac:  Visualized. Embryo:  Visualized. Cardiac Activity: Not Visualized. Heart Rate: N/A  bpm CRL:  4.0 mm   6 w   1 d                  [place] [facility]: May 21, 2023 Subchorionic hemorrhage:  None visualized. Maternal uterus/adnexae: The right and left ovaries are visualized and are normal in appearance. IMPRESSION: Findings are suspicious but not yet definitive for failed pregnancy (crown-rump length less than 7 mm and no fetal heart beat). Recommend follow-up US in 10-14 days for definitive diagnosis. This recommendation follows SRU consensus guidelines: Diagnostic Criteria for Nonviable Pregnancy Early in the First Trimester. Alta Corning Med 2013; 329:5188-41. Electronically Signed   By: Virgina Norfolk M.D.   On: 09/26/2022 21:22  MDM Prenatal records from community office {PrenatalRecords:27510}. Pregnancy complicated by *** {uncomplicated}. Labs ordered and reviewed.   ***  CNM independently reviewed the imaging ordered. Imaging show ***  CNM consulted with Dr. Marland Kitchen regarding presentation and results- MD recommends  Assessment and Plan   1. Threatened miscarriage in early pregnancy   2. IUP (intrauterine pregnancy), incidental   3. [redacted] weeks gestation of pregnancy     -Discharge home in stable condition -Rx for *** -*** precautions discussed -Patient advised to follow-up with *** -Patient may return to MAU as needed or if her  condition were to change or worsen  Wende Mott, CNM 09/26/2022, 10:01 PM

## 2022-09-26 NOTE — MAU Note (Addendum)
Pt says she was here Monday - had more pain and bleeding then - than she does now This am - passed something but Now wants to know if she's still preg  No pain now-on pad- quarter size pink spot

## 2022-09-26 NOTE — Discharge Instructions (Signed)

## 2022-10-01 ENCOUNTER — Inpatient Hospital Stay (HOSPITAL_COMMUNITY)
Admission: AD | Admit: 2022-10-01 | Discharge: 2022-10-02 | Disposition: A | Discharge status: Home / Self Care | Payer: Medicaid Other | Attending: Obstetrics and Gynecology | Admitting: Obstetrics and Gynecology

## 2022-10-01 ENCOUNTER — Inpatient Hospital Stay (HOSPITAL_COMMUNITY): Payer: Medicaid Other

## 2022-10-01 ENCOUNTER — Ambulatory Visit: Payer: Self-pay | Admitting: *Deleted

## 2022-10-01 ENCOUNTER — Encounter (HOSPITAL_COMMUNITY): Payer: Self-pay | Admitting: Obstetrics and Gynecology

## 2022-10-01 DIAGNOSIS — O09521 Supervision of elderly multigravida, first trimester: Secondary | ICD-10-CM | POA: Diagnosis not present

## 2022-10-01 DIAGNOSIS — O034 Incomplete spontaneous abortion without complication: Secondary | ICD-10-CM | POA: Diagnosis present

## 2022-10-01 DIAGNOSIS — O039 Complete or unspecified spontaneous abortion without complication: Secondary | ICD-10-CM | POA: Diagnosis not present

## 2022-10-01 DIAGNOSIS — Z3A01 Less than 8 weeks gestation of pregnancy: Secondary | ICD-10-CM | POA: Diagnosis not present

## 2022-10-01 DIAGNOSIS — Z6740 Type O blood, Rh positive: Secondary | ICD-10-CM

## 2022-10-01 LAB — URINALYSIS, ROUTINE W REFLEX MICROSCOPIC
Bilirubin Urine: NEGATIVE
Glucose, UA: NEGATIVE mg/dL
Ketones, ur: NEGATIVE mg/dL
Leukocytes,Ua: NEGATIVE
Nitrite: NEGATIVE
Protein, ur: NEGATIVE mg/dL
RBC / HPF: >50 RBC/hpf — ABNORMAL HIGH (ref 0–5)
Specific Gravity, Urine: 1.019 (ref 1.005–1.030)
pH: 5 (ref 5.0–8.0)

## 2022-10-01 MED ORDER — IBUPROFEN 800 MG PO TABS
800.0000 mg | ORAL_TABLET | Freq: Once | ORAL | Status: AC
  Administered 2022-10-01: 800 mg via ORAL
  Filled 2022-10-01: qty 1

## 2022-10-01 NOTE — MAU Provider Note (Signed)
History     CSN: [id number]  Arrival date and time: 10/01/22 2115   Event Date/Time   First Provider Initiated Contact with Patient 10/01/22 2145      Chief Complaint  Patient presents with   Abdominal Pain   Vaginal Bleeding   Katherine Paul is a 37 y.o. T5V7616 at [redacted]w[redacted]d who presents today with vaginal bleeding. She was seen here on 12/30 and 01/05 for bleeding. She had [place] on both those dates that could not definitely diagnosed miscarriage. She has continued to bleed every day since her first visit. She states that on 09/28/2022 she had a lot of heavy bleeding with clots. Then it slowed down. Now she is just having bleeding that she notices when she wipes. However, just prior to arrival she passed some grey/yellow tissue. She states that now bleeding has mostly stopped and she just has 4/10 pain that comes and goes. She has taken tylenol for the pain and that has helped.   Vaginal Bleeding The patient's primary symptoms include pelvic pain and vaginal bleeding. This is a new problem. The current episode started 1 to 4 weeks ago. The problem occurs intermittently. The problem has been waxing and waning. The problem affects the right side. She is pregnant. The vaginal discharge was bloody. The vaginal bleeding is typical of menses. She has been passing clots. She has been passing tissue. Nothing aggravates the symptoms. She has tried acetaminophen for the symptoms. The treatment provided moderate relief.    OB History     Gravida  4   Para  2   Term  2   Preterm      AB  1   Living  2      SAB  1   IAB      Ectopic      Multiple  0   Live Births  1           History reviewed. No pertinent past medical history.  Past Surgical History:  Procedure Laterality Date   CHOLECYSTECTOMY, LAPAROSCOPIC  01/31/08    Family History  Problem Relation Age of Onset   Healthy Mother     Social History   Tobacco Use   Smoking status: Never   Smokeless tobacco: Never   Vaping Use   Vaping Use: Never used  Substance Use Topics   Alcohol use: No   Drug use: No    Allergies: No Known Allergies  Medications Prior to Admission  Medication Sig Dispense Refill Last Dose   acetaminophen (TYLENOL) 500 MG tablet Take 500 mg by mouth every 6 (six) hours as needed for mild pain.   10/01/2022   omeprazole (PRILOSEC) 40 MG capsule Take 1 capsule (40 mg total) by mouth daily. 30 capsule 1    sucralfate (CARAFATE) 1 g tablet Take 1 tablet (1 g total) by mouth 2 (two) times daily. 60 tablet 0    UNKNOWN TO PATIENT "Med for gastritis pain from a friend"       Review of Systems  Genitourinary:  Positive for pelvic pain.  All other systems reviewed and are negative.  Physical Exam   Blood pressure 122/69, pulse 61, temperature (!) 97.5 F (36.4 C), temperature source Oral, resp. rate 17, height 5' (1.524 m), weight 85.6 kg, last menstrual period 07/24/2022, SpO2 100 %.  Physical Exam Constitutional:      Appearance: She is well-developed.  HENT:     Head: Normocephalic.  Eyes:     Pupils: Pupils are  equal, round, and reactive to light.  Cardiovascular:     Rate and Rhythm: Normal rate.  Pulmonary:     Effort: Pulmonary effort is normal. No respiratory distress.  Abdominal:     Palpations: Abdomen is soft.     Tenderness: There is no abdominal tenderness.  Genitourinary:    Vagina: No bleeding. Vaginal discharge: mucusy.    Comments: External: no lesion Vagina: small amount of white discharge     Musculoskeletal:        General: Normal range of motion.     Cervical back: Normal range of motion and neck supple.  Skin:    General: Skin is warm and dry.  Neurological:     Mental Status: She is alert and oriented to person, place, and time.  Psychiatric:        Mood and Affect: Mood normal.        Behavior: Behavior normal.   US OB Transvaginal  Result Date: 10/01/2022 CLINICAL DATA:  Vaginal bleeding during pregnancy EXAM: TRANSVAGINAL OB  ULTRASOUND TECHNIQUE: Transvaginal ultrasound was performed for complete evaluation of the gestation as well as the maternal uterus, adnexal regions, and pelvic cul-de-sac. COMPARISON:  Obstetrical ultrasound 09/26/2022 FINDINGS: Intrauterine gestational sac: None Endometrium: Measures 1 cm in thickness and contains mild vascularity. Maternal uterus/adnexae: The bilateral ovaries appear within normal limits. Corpus luteum is noted in the right ovary, unchanged. There is no pelvic free fluid. IMPRESSION: 1. No intrauterine gestational sac identified. Findings are most consistent with completed abortion. There is mild vascularity in the endometrium which can be seen with retained products of conception. Electronically Signed   By: Ronney Asters M.D.   On: 10/01/2022 22:31    MAU Course  Procedures  MDM Patient has brought the tissue that she passed with her as she was advised by the phone triage RN to bring it. On inspection it does appear to be a gestational sac and fetal pole. I will send this to pathology.   Reviewed [place] results with the patient. Offered cytotec. Patient declines at this time. Will send message to clinic to schedule 2 week visit and cancel [place] appt for 1/16  Assessment and Plan   1. SAB (spontaneous abortion)   2. [redacted] weeks gestation of pregnancy   3. Type O blood, Rh positive    DC home in stable condition  Comfort measures reviewed  Bleeding precautions RX: none  Return to MAU as needed FU with OB as planned   [facility] Follow up in 2 week(s).   Specialty: Obstetrics and Gynecology Why: They will call you with an appointment Contact information: [place] [phone] Luana, CNM  10/01/22  11:36 PM

## 2022-10-01 NOTE — Telephone Encounter (Signed)
  Chief Complaint: Vaginal Bleeding Symptoms: [redacted] weeks pregnant. Passed tissue, vaginal bleeding with abdominal pain, few clots. Frequency: This evening Pertinent Negatives: Patient denies dizziness Disposition: [x] ED /[] Urgent Care (no appt availability in office) / [] Appointment(In office/virtual)/ []   [facility]/ [] Home Care/ [] Refused Recommended Disposition /[] [facility]/ []  Follow-up with PCP Additional Notes: Advised ED, states will follow disposition. Care advise provided, pt verbalizes understanding. Reason for Disposition  Passed tissue (e.g., gray-white)  Answer Assessment - Initial Assessment Questions 1. ONSET: "When did this bleeding start?"       12 days ago 2. DESCRIPTION: "Describe the bleeding that you are having." "How much bleeding is there?"    - SPOTTING: spotting, or pinkish / brownish mucous discharge; does not fill panty liner or pad    - MILD:  less than 1 pad / hour; less than patient's usual menstrual bleeding   - MODERATE: 1-2 pads / hour; 1 menstrual cup every 6 hours; small-medium blood clots (e.g., pea, grape, small coin)   - SEVERE: soaking 2 or more pads/hour for 2 or more hours; 1 menstrual cup every 2 hours; bleeding not contained by pads or continuous red blood from vagina; large blood clots (e.g., golf ball, large coin)      Varies, some clots. Pain few minutes ago 3. ABDOMINAL PAIN SEVERITY: If present, ask: "How bad is it?"  (e.g., Scale 1-10; mild, moderate, or severe)   - MILD (1-3): doesn't interfere with normal activities, abdomen soft and not tender to touch    - MODERATE (4-7): interferes with normal activities or awakens from sleep, abdomen tender to touch    - SEVERE (8-10): excruciating pain, doubled over, unable to do any normal activities     5-6/10  In BR 9-10/10 4. PREGNANCY: "Do you know how many weeks or months pregnant you are?" "When was the first day of your last normal menstrual period?"     8 weeks 5.  HEMODYNAMIC STATUS: "Are you weak or feeling lightheaded?" If Yes, ask: "Can you stand and walk normally?"      No 6. OTHER SYMPTOMS: "What other symptoms are you having with the bleeding?" (e.g., passed tissue, vaginal discharge, fever, menstrual-type cramps)     Passed tissue "Like a balloon."  Protocols used: Pregnancy - Vaginal Bleeding Less Than [redacted] Weeks EGA-A-AH

## 2022-10-01 NOTE — MAU Note (Signed)
..  Katherine Paul is a 37 y.o. at [redacted]w[redacted]d here in MAU reporting: Has continued to have vaginal bleeding since last MAU visit. Has had some clots and around 5pm she had some tissue come out. From 2-5pm she had a lot of cramping, and now only has a little bit of pain and her bleeding is less.   Pain score: 3/10 Vitals:   10/01/22 2127 10/01/22 2128  BP:  122/69  Pulse:  61  Resp:  17  Temp:  (!) 97.5 F (36.4 C)  SpO2: 100% 100%   Lab orders placed from triage:  ua

## 2022-10-03 LAB — SURGICAL PATHOLOGY

## 2022-10-07 ENCOUNTER — Other Ambulatory Visit: Payer: Medicaid Other

## 2022-10-15 ENCOUNTER — Encounter: Payer: Self-pay | Admitting: Family Medicine

## 2022-10-15 ENCOUNTER — Ambulatory Visit (INDEPENDENT_AMBULATORY_CARE_PROVIDER_SITE_OTHER): Payer: Medicaid Other | Admitting: Family Medicine

## 2022-10-15 VITALS — BP 135/77 | HR 77 | Wt 189.4 lb

## 2022-10-15 DIAGNOSIS — Z30016 Encounter for initial prescription of transdermal patch hormonal contraceptive device: Secondary | ICD-10-CM | POA: Diagnosis not present

## 2022-10-15 MED ORDER — NORELGESTROMIN-ETH ESTRADIOL 150-35 MCG/24HR TD PTWK: 1.0000 | MEDICATED_PATCH | TRANSDERMAL | 5 refills | Status: DC

## 2022-10-15 NOTE — Progress Notes (Signed)
   MISCARRIAGE/DC follow up  Subjective:   Katherine Paul is a 37 y.o. [phone] female here for  F/u after DC of [redacted]w[redacted]d pregnancy. Denies continued bleeding/ cramping. Reports emotionally OK -- she is unsure if she wants pregnancy. She does think she might want a baby   The following portions of the patient's history were reviewed and updated as appropriate: allergies, current medications, past family history, past medical history, past social history, past surgical history and problem list.  Review of Systems Pertinent items are noted in HPI.   Objective:  BP 135/77   Pulse 77   Wt 189 lb 6.4 oz (85.9 kg)   LMP 07/24/2022 (Approximate)   Breastfeeding Unknown   BMI 36.99 kg/m  Gen: well appearing, NAD HEENT: no scleral icterus CV: RR Lung: Normal WOB Ext: warm well perfused  Lab Results  Component Value Date   ABORH O POS 05/07/2017     Assessment and Plan:  1. Miscarriage within last 12 months - Missed AB or incomplete AB? No  - reviewed preconception health  - reviewed contraception and basic fertility. Patient does not desire pregnancy within 12 months and will use  Patch - provided resources and support regarding pregnancy loss.   1. Encounter for initial prescription of transdermal patch hormonal contraceptive device - norelgestromin-ethinyl estradiol Katherine Paul) 150-35 MCG/24HR transdermal patch; Place 1 patch onto the skin once a week.  Dispense: 9 patch; Refill: 5   Please refer to After Visit Summary for other counseling recommendations.   Return in about 4 weeks (around 11/12/2022) for BP check and Pap .  Future Appointments  Date Time Provider [place]  11/18/2022  8:35 AM Dione Plover, Annice Needy, MD [facility]

## 2022-10-28 ENCOUNTER — Ambulatory Visit (INDEPENDENT_AMBULATORY_CARE_PROVIDER_SITE_OTHER): Payer: Medicaid Other

## 2022-10-28 ENCOUNTER — Ambulatory Visit (HOSPITAL_COMMUNITY)
Admission: EM | Admit: 2022-10-28 | Discharge: 2022-10-28 | Disposition: A | Discharge status: Home / Self Care | Payer: Medicaid Other | Attending: Family Medicine | Admitting: Family Medicine

## 2022-10-28 ENCOUNTER — Encounter (HOSPITAL_COMMUNITY): Payer: Self-pay | Admitting: Emergency Medicine

## 2022-10-28 DIAGNOSIS — J111 Influenza due to unidentified influenza virus with other respiratory manifestations: Secondary | ICD-10-CM | POA: Diagnosis not present

## 2022-10-28 DIAGNOSIS — J069 Acute upper respiratory infection, unspecified: Secondary | ICD-10-CM

## 2022-10-28 DIAGNOSIS — R059 Cough, unspecified: Secondary | ICD-10-CM

## 2022-10-28 LAB — POC INFLUENZA A AND B ANTIGEN (URGENT CARE ONLY)
Influenza A Ag: NEGATIVE
Influenza B Ag: NEGATIVE

## 2022-10-28 MED ORDER — BENZONATATE 200 MG PO CAPS: 200.0000 mg | ORAL_CAPSULE | Freq: Three times a day (TID) | ORAL | 0 refills | Status: DC | PRN

## 2022-10-28 MED ORDER — OSELTAMIVIR PHOSPHATE 75 MG PO CAPS: 75.0000 mg | ORAL_CAPSULE | Freq: Two times a day (BID) | ORAL | 0 refills | Status: DC

## 2022-10-28 NOTE — ED Triage Notes (Signed)
Pt had congestion, sore throat, cough, headache since Sunday. Reports was exposed to the flu. Took tylenol and cough syrup.

## 2022-10-28 NOTE — Discharge Instructions (Addendum)
Lo atendieron hoy por sntomas de las vas respiratorias superiores. Su prueba de gripe fue negativa y su radiografa de trax fue normal. Sin embargo, dada su exposicin a la gripe (su hija), la estoy tratando con tamiflu. Comience esto lo antes posible para ayudar a reducir la gravedad de los sntomas. Tambin le envi una pastilla para ayudar con la tos. Tmelo tres veces al [employer]. Puede usar tylenol o motrin para Physiological scientist, incluido el dolor de pecho debido a la tos.  You were seen today for upper respiratory symptoms.  Your flu swab was negative, and your chest xray was normal.  However, given your exposure to flu (your daughter) I am treating you with tamiflu.  Please start this as soon as possible to help reduce symptom severity.  I have also sent out a pill to help with cough.  Please take this three times/day.   You may use tylenol or motrin for any body aches, including chest pain due to cough.

## 2022-10-28 NOTE — ED Provider Notes (Signed)
Katherine Paul    CSN: [id number] Arrival date & time: 10/28/22  1156      History   Chief Complaint Chief Complaint  Patient presents with   Cough   Nasal Congestion    HPI Katherine Paul is a 37 y.o. female.   Spanish interpreter used.  Patient is here for URI symptoms.  She started 3-4 days ago with headache, sore throat and runny nose.  Itchy sensation from her throat to ear.   She has cough, with severe chest pain.  Last night she was having sob/wheezing.   She has not checked her temp.  No fevers/chills she is aware of.  Some nausea, no vomiting.  She has used otc medications.  She was around someone with flu on Friday;  Cannot remember if her symptoms started before or after that.  Her daughter has been sick with fever x 1 week.        History reviewed. No pertinent past medical history.  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 Authorizing Provider  acetaminophen (TYLENOL) 500 MG tablet Take 500 mg by mouth every 6 (six) hours as needed for mild pain.    [provider]  norelgestromin-ethinyl estradiol [name]) 150-35 MCG/24HR transdermal patch Place 1 patch onto the skin once a  week. 10/15/22   Caren Macadam, MD    Family History Family History  Problem Relation Age of Onset   Healthy Mother     Social History Social History   Tobacco Use   Smoking status: Never   Smokeless tobacco: Never  Vaping Use   Vaping Use: Never used  Substance Use Topics   Alcohol use: No   Drug use: No     Allergies   Patient has no known allergies.   Review of Systems Review of Systems  Constitutional:  Positive for fatigue. Negative for chills and fever.  HENT:  Positive for congestion, rhinorrhea and sore throat.   Respiratory:  Positive for cough.   Gastrointestinal:  Positive for nausea. Negative for abdominal pain and vomiting.  Genitourinary: Negative.   Skin: Negative.   Neurological:  Positive for headaches.  Psychiatric/Behavioral: Negative.       Physical Exam Triage Vital Signs ED Triage Vitals  Enc Vitals Group     BP 10/28/22 1335 121/75     Pulse Rate 10/28/22 1335 79     Resp 10/28/22 1335 17     Temp 10/28/22 1335 98.5 F (36.9 C)  Temp Source 10/28/22 1335 Oral     SpO2 10/28/22 1335 98 %     Weight --      Height --      Head Circumference --      Peak Flow --      Pain Score 10/28/22 1336 6     Pain Loc --      Pain Edu? --      Excl. in [place]? --    No data found.  Updated Vital Signs BP 121/75 (BP Location: Left Arm)   Pulse 79   Temp 98.5 F (36.9 C) (Oral)   Resp 17   SpO2 98%   Breastfeeding No   Visual Acuity Right Eye Distance:   Left Eye Distance:   Bilateral Distance:    Right Eye Near:   Left Eye Near:    Bilateral Near:     Physical Exam Constitutional:      Appearance: Normal appearance.  HENT:     Head: Normocephalic.     Nose: Congestion and rhinorrhea present.     Mouth/Throat:     Pharynx: Posterior oropharyngeal erythema present. No pharyngeal swelling or oropharyngeal exudate.     Tonsils: No tonsillar exudate.  Cardiovascular:     Rate and Rhythm: Normal rate and regular rhythm.   Pulmonary:     Effort: Pulmonary effort is normal.     Breath sounds: Normal breath sounds.  Musculoskeletal:     Cervical back: Normal range of motion and neck supple.  Skin:    General: Skin is warm.  Neurological:     General: No focal deficit present.     Mental Status: She is alert.  Psychiatric:        Mood and Affect: Mood normal.      UC Treatments / Results  Labs (all labs ordered are listed, but only abnormal results are displayed) Labs Reviewed  POC INFLUENZA A AND B ANTIGEN (URGENT CARE ONLY)    EKG   Radiology DG Chest 2 View  Result Date: 10/28/2022 CLINICAL DATA:  Cough EXAM: CHEST - 2 VIEW COMPARISON:  None Available. FINDINGS: The heart size and mediastinal contours are within normal limits. Both lungs are clear. The visualized skeletal structures are unremarkable. IMPRESSION: No active cardiopulmonary disease. Electronically Signed   By: Dorise Bullion III M.D.   On: 10/28/2022 15:04    Procedures Procedures (including critical care time)  Medications Ordered in UC Medications - No data to display  Initial Impression / Assessment and Plan / UC Course  I have reviewed the triage vital signs and the nursing notes.  Pertinent labs & imaging results that were available during my care of the patient were reviewed by me and considered in my medical decision making (see chart for details).  Patient was seen today for URI symptoms.  Her daughter tested positive for flu today in the clinic, but hers was negative.  However, I am treating her with tamiflu for likely influenza.    Final Clinical Impressions(s) / UC Diagnoses   Final diagnoses:  Influenza with respiratory manifestation  Viral URI with cough     Discharge Instructions      Lo atendieron hoy por sntomas de las vas respiratorias superiores. Su prueba de gripe fue negativa y su radiografa de trax fue normal. Sin embargo, dada su exposicin a la gripe (su hija), la estoy tratando con  tamiflu. Comience esto lo antes posible para ayudar a reducir la gravedad de los sntomas. Tambin le  envi una pastilla para ayudar con la tos. Tmelo tres veces al [employer]. Puede usar tylenol o motrin para Physiological scientist, incluido el dolor de pecho debido a la tos.  You were seen today for upper respiratory symptoms.  Your flu swab was negative, and your chest xray was normal.  However, given your exposure to flu (your daughter) I am treating you with tamiflu.  Please start this as soon as possible to help reduce symptom severity.  I have also sent out a pill to help with cough.  Please take this three times/day.   You may use tylenol or motrin for any body aches, including chest pain due to cough.     ED Prescriptions     Medication Sig Dispense Auth. Provider   oseltamivir (TAMIFLU) 75 MG capsule Take 1 capsule (75 mg total) by mouth every 12 (twelve) hours. 10 capsule Stratton Villwock, MD   benzonatate (TESSALON) 200 MG capsule Take 1 capsule (200 mg total) by mouth 3 (three) times daily as needed for cough. 21 capsule Rondel Oh, MD      PDMP not reviewed this encounter.   Rondel Oh, MD 10/28/22 [phone]

## 2022-11-18 ENCOUNTER — Ambulatory Visit (INDEPENDENT_AMBULATORY_CARE_PROVIDER_SITE_OTHER): Payer: Medicaid Other | Admitting: Obstetrics and Gynecology

## 2022-11-18 ENCOUNTER — Other Ambulatory Visit (HOSPITAL_COMMUNITY)
Admission: RE | Admit: 2022-11-18 | Discharge: 2022-11-18 | Disposition: A | Discharge status: Home / Self Care | Payer: Medicaid Other | Source: Ambulatory Visit | Attending: Obstetrics and Gynecology | Admitting: Obstetrics and Gynecology

## 2022-11-18 ENCOUNTER — Encounter: Payer: Self-pay | Admitting: Obstetrics and Gynecology

## 2022-11-18 VITALS — BP 113/77 | HR 68

## 2022-11-18 DIAGNOSIS — Z124 Encounter for screening for malignant neoplasm of cervix: Secondary | ICD-10-CM | POA: Insufficient documentation

## 2022-11-18 DIAGNOSIS — R350 Frequency of micturition: Secondary | ICD-10-CM | POA: Insufficient documentation

## 2022-11-18 DIAGNOSIS — O039 Complete or unspecified spontaneous abortion without complication: Secondary | ICD-10-CM | POA: Diagnosis not present

## 2022-11-18 DIAGNOSIS — Z3A08 8 weeks gestation of pregnancy: Secondary | ICD-10-CM | POA: Diagnosis not present

## 2022-11-18 DIAGNOSIS — Z3045 Encounter for surveillance of transdermal patch hormonal contraceptive device: Secondary | ICD-10-CM

## 2022-11-18 NOTE — Progress Notes (Signed)
    GYNECOLOGY VISIT  Patient name: Katherine Paul MRN [id number]  Date of birth: 1985-10-16 Chief Complaint:   No chief complaint on file.   History:  Katherine Paul is a 37 y.o. [phone] being seen today for BP check and follow up on xulane patch. 8wk pregnancy loss confirmed on pathology of POC passed in MAU .    Having urinary frequency and noted small blood on occasion when she voids. She has been having urinary symptoms since the summer. Wondering if she has an infeciton and if infection can cause her miscarriage. She has not started birth control patch yet - would like to wait until next menstrual cycle has started.   No past medical history on file.  Past Surgical History:  Procedure Laterality Date  . CHOLECYSTECTOMY, LAPAROSCOPIC  01/31/08    The following portions of the patient's history were reviewed and updated as appropriate: allergies, current medications, past family history, past medical history, past social history, past surgical history and problem list.   Health Maintenance:   Last pap ***. Results were: {Pap findings:25134}. H/O abnormal pap: {yes/yes***/no:23866} Last mammogram: ***. Results were: {normal, abnormal, n/a:23837}. Family h/o breast cancer: {yes***/no:23838}   Review of Systems:  {Ros - complete:30496} Comprehensive review of systems was otherwise negative.   Objective:  Physical Exam There were no vitals taken for this visit.   Physical Exam   Labs and Imaging DG Chest 2 View  Result Date: 10/28/2022 CLINICAL DATA:  Cough EXAM: CHEST - 2 VIEW COMPARISON:  None Available. FINDINGS: The heart size and mediastinal contours are within normal limits. Both lungs are clear. The visualized skeletal structures are unremarkable. IMPRESSION: No active cardiopulmonary disease. Electronically Signed   By: Dorise Bullion III M.D.   On: 10/28/2022 15:04       Assessment & Plan:   There are no diagnoses linked to this encounter.   *** Routine  preventative health maintenance measures emphasized.  Darliss Cheney, MD [facility] [place]

## 2022-11-19 LAB — URINE CULTURE

## 2022-11-19 LAB — CERVICOVAGINAL ANCILLARY ONLY
Bacterial Vaginitis (gardnerella): NEGATIVE
Candida Glabrata: NEGATIVE
Candida Vaginitis: NEGATIVE
Chlamydia: NEGATIVE
Comment: NEGATIVE
Comment: NEGATIVE
Comment: NEGATIVE
Comment: NEGATIVE
Comment: NEGATIVE
Comment: NORMAL
Neisseria Gonorrhea: NEGATIVE
Trichomonas: NEGATIVE

## 2022-11-20 ENCOUNTER — Telehealth: Payer: Self-pay | Admitting: *Deleted

## 2022-11-20 NOTE — Telephone Encounter (Addendum)
-----   Message from Darliss Cheney, MD sent at 11/20/2022 10:06 AM EST ----- Notify of negative urine and vaginal swabs - no infection.   2/29  1604  Called pt w/interpreter Eda Royal and informed her of test results as stated by Dr. Currie Paris. She voiced understanding and had no questions.

## 2022-11-24 LAB — CYTOLOGY - PAP
Comment: NEGATIVE
Diagnosis: UNDETERMINED — AB
High risk HPV: NEGATIVE

## 2022-11-26 ENCOUNTER — Telehealth: Payer: Self-pay | Admitting: Lactation Services

## 2022-11-26 NOTE — Telephone Encounter (Signed)
-----   Message from Darliss Cheney, MD sent at 11/26/2022  1:20 PM EST ----- PAP shows ASCUS with negative HPV - recommend repeat in 3 years.

## 2022-11-26 NOTE — Telephone Encounter (Signed)
Called patient with assistance of [employer], in house interpreter. Patient did not answer. LM for her to call the office with non urgent test results.

## 2022-11-28 NOTE — Telephone Encounter (Signed)
Called pt with interpreter Rosemarie Ax; results and provider recommendation reviewed.

## 2023-04-18 ENCOUNTER — Ambulatory Visit (HOSPITAL_COMMUNITY)
Admission: RE | Admit: 2023-04-18 | Discharge: 2023-04-18 | Disposition: A | Discharge status: Home / Self Care | Payer: Medicaid Other | Source: Ambulatory Visit | Attending: Internal Medicine | Admitting: Internal Medicine

## 2023-04-18 ENCOUNTER — Encounter (HOSPITAL_COMMUNITY): Payer: Self-pay

## 2023-04-18 VITALS — BP 103/66 | HR 76 | Temp 98.4°F | Resp 16

## 2023-04-18 DIAGNOSIS — R300 Dysuria: Secondary | ICD-10-CM | POA: Diagnosis present

## 2023-04-18 LAB — POCT URINALYSIS DIP (MANUAL ENTRY)
Bilirubin, UA: NEGATIVE
Glucose, UA: NEGATIVE mg/dL
Nitrite, UA: NEGATIVE
Protein Ur, POC: NEGATIVE mg/dL
Spec Grav, UA: 1.02 (ref 1.010–1.025)
Urobilinogen, UA: 0.2 E.U./dL
pH, UA: 7 (ref 5.0–8.0)

## 2023-04-18 LAB — POCT URINE PREGNANCY: Preg Test, Ur: NEGATIVE

## 2023-04-18 MED ORDER — CEPHALEXIN 500 MG PO CAPS: 500.0000 mg | ORAL_CAPSULE | Freq: Three times a day (TID) | ORAL | 0 refills | Status: AC

## 2023-04-18 NOTE — ED Provider Notes (Signed)
Katherine Paul [facility]   MRN: [id number] DOB: 1986/01/10  Subjective:   Katherine Paul is a 37 y.o. female presenting for urinary frequency, dysuria, hematuria x 6 days.  She has been drinking a lot of water and tea.  She has been taking some Tylenol to help with some of the pain.  Still having symptoms.  Denies any fever or chills.  Mild lower abdominal pain.  No back or flank pain.  Last menstrual period was July 19 per patient.  Due to language barrier, a virtual interpreter was present during the history-taking and subsequent discussion (and for part of the physical exam) with this patient.   No current facility-administered medications for this encounter.  Current Outpatient Medications:    cephALEXin (KEFLEX) 500 MG capsule, Take 1 capsule (500 mg total) by mouth 3 (three) times daily for 7 days., Disp: 21 capsule, Rfl: 0   acetaminophen (TYLENOL) 500 MG tablet, Take 500 mg by mouth every 6 (six) hours as needed for mild pain., Disp: , Rfl:    benzonatate (TESSALON) 200 MG capsule, Take 1 capsule (200 mg total) by mouth 3 (three) times daily as needed for cough. (Patient not taking: Reported on 11/18/2022), Disp: 21 capsule, Rfl: 0   norelgestromin-ethinyl estradiol (XULANE) 150-35 MCG/24HR transdermal patch, Place 1 patch onto the skin once a week., Disp: 9 patch, Rfl: 5   oseltamivir (TAMIFLU) 75 MG capsule, Take 1 capsule (75 mg total) by mouth every 12 (twelve) hours. (Patient not taking: Reported on 11/18/2022), Disp: 10 capsule, Rfl: 0   No Known Allergies  History reviewed. No pertinent past medical history.   Past Surgical History:  Procedure Laterality Date   CHOLECYSTECTOMY, LAPAROSCOPIC  01/31/08    Family History  Problem Relation Age of Onset   Healthy Mother     Social History   Tobacco Use   Smoking status: Never   Smokeless tobacco: Never  Vaping Use   Vaping status: Never Used  Substance Use Topics   Alcohol use: No   Drug use: No     ROS REFER TO HPI FOR PERTINENT POSITIVES AND NEGATIVES   Objective:   Vitals: BP 103/66 (BP Location: Right Arm)   Pulse 76   Temp 98.4 F (36.9 C) (Oral)   Resp 16   LMP 03/31/2023   SpO2 97%   Physical Exam Vitals and nursing note reviewed.  Constitutional:      General: She is not in acute distress.    Appearance: Normal appearance. She is not ill-appearing.  HENT:     Head: Normocephalic and atraumatic.  Cardiovascular:     Rate and Rhythm: Normal rate and regular rhythm.     Pulses: Normal pulses.     Heart sounds: Normal heart sounds.  Pulmonary:     Effort: Pulmonary effort is normal.     Breath sounds: Normal breath sounds.  Abdominal:     General: Abdomen is flat. Bowel sounds are normal.     Palpations: Abdomen is soft.     Tenderness: There is abdominal tenderness (suprapubic). There is no right CVA tenderness or left CVA tenderness.  Skin:    General: Skin is warm and dry.  Neurological:     General: No focal deficit present.     Mental Status: She is alert.  Psychiatric:        Mood and Affect: Mood normal.     Results for orders placed or performed during the hospital encounter of 04/18/23 (from the past  24 hour(s))  POCT urinalysis dipstick     Status: Abnormal   Collection Time: 04/18/23  4:27 PM  Result Value Ref Range   Color, UA yellow yellow   Clarity, UA cloudy (A) clear   Glucose, UA negative negative mg/dL   Bilirubin, UA negative negative   Ketones, POC UA trace (5) (A) negative mg/dL   Spec Grav, UA 1.324 4.010 - 1.025   Blood, UA trace-lysed (A) negative   pH, UA 7.0 5.0 - 8.0   Protein Ur, POC negative negative mg/dL   Urobilinogen, UA 0.2 0.2 or 1.0 E.U./dL   Nitrite, UA Negative Negative   Leukocytes, UA Small (1+) (A) Negative  POCT urine pregnancy     Status: None   Collection Time: 04/18/23  4:39 PM  Result Value Ref Range   Preg Test, Ur Negative Negative    Assessment and Plan :   PDMP not reviewed this  encounter.  1. Dysuria    Presentation consistent with acute cystitis with hematuria.  No red flag symptoms on exam.  Urine pregnancy test negative.  Started on cephalexin.  Culture urine, change antibiotic if needed pending results.  She knows to push fluids.  Return to care precautions advised.    AllwardtCrist Infante, PA-C 04/18/23 1644

## 2023-04-18 NOTE — ED Triage Notes (Addendum)
Patient reports that she has had urinary frequency, dysuria, and hematuria x 6 days. Patient denies fever.   Per Interpreter-Fernando #[id number]

## 2023-04-18 NOTE — Discharge Instructions (Signed)
Very good to meet you today.  Your urine looks suspicious for a urinary tract infection.  Please start on the cephalexin antibiotic as prescribed 3 times a day for the next 7 days.  Drink lots of water.  Avoid caffeine.  You may use Tylenol or ibuprofen as needed for pain.  You can also take AZO over-the-counter for 2 days to help with urinary symptoms.  Your pregnancy test was negative.  Please recheck with your primary care provider if symptoms persist.

## 2023-04-19 LAB — URINE CULTURE: Culture: NO GROWTH

## 2024-03-19 ENCOUNTER — Ambulatory Visit (INDEPENDENT_AMBULATORY_CARE_PROVIDER_SITE_OTHER)

## 2024-03-19 ENCOUNTER — Encounter (HOSPITAL_COMMUNITY): Payer: Self-pay

## 2024-03-19 ENCOUNTER — Ambulatory Visit (HOSPITAL_COMMUNITY)
Admission: RE | Admit: 2024-03-19 | Discharge: 2024-03-19 | Disposition: A | Discharge status: Home / Self Care | Payer: Self-pay | Source: Ambulatory Visit | Attending: Physician Assistant | Admitting: Physician Assistant

## 2024-03-19 VITALS — BP 114/78 | HR 66 | Temp 98.3°F | Resp 17 | Ht 60.0 in | Wt 197.0 lb

## 2024-03-19 DIAGNOSIS — M25561 Pain in right knee: Secondary | ICD-10-CM

## 2024-03-19 MED ORDER — CYCLOBENZAPRINE HCL 10 MG PO TABS: 10.0000 mg | ORAL_TABLET | Freq: Two times a day (BID) | ORAL | 0 refills | Status: DC | PRN

## 2024-03-19 MED ORDER — KETOROLAC TROMETHAMINE 30 MG/ML IJ SOLN
INTRAMUSCULAR | Status: AC
  Filled 2024-03-19: qty 1

## 2024-03-19 MED ORDER — KETOROLAC TROMETHAMINE 30 MG/ML IJ SOLN
30.0000 mg | Freq: Once | INTRAMUSCULAR | Status: AC
  Administered 2024-03-19: 30 mg via INTRAMUSCULAR

## 2024-03-19 MED ORDER — MELOXICAM 7.5 MG PO TABS
7.5000 mg | ORAL_TABLET | Freq: Every day | ORAL | 0 refills | Status: AC
Start: 2024-03-19 — End: ?

## 2024-03-19 NOTE — ED Triage Notes (Signed)
 Pt states that she has some right knee pain. Pt states that she also has some right calf pain that radiates down to her right ankle. X2 weeks

## 2024-03-19 NOTE — ED Provider Notes (Signed)
 [facility]    CSN: [id number] Arrival date & time: 03/19/24  1218      History   Chief Complaint Chief Complaint  Patient presents with   Knee Injury    Entered by patient    HPI Katherine Paul is a 38 y.o. female.   Patient complains of right knee pain that started about 2 weeks ago.  She reports standing up from sitting when the pain started.  She reports some radiation down the back of her calf to the ankle.  No problems with ambulation.  She has had problems with this knee in the past which resolved with time.  Denies calf swelling or redness. patient is able to bear weight.  She has tried ibuprofen  with temporary relief    History reviewed. No pertinent past medical history.  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 Authorizing Provider  cyclobenzaprine (FLEXERIL) 10 MG tablet Take 1 tablet (10 mg total) by mouth 2 (two) times daily as needed for muscle spasms. 03/19/24  Yes Ward, Harlene PEDLAR, PA-C  meloxicam (MOBIC) 7.5 MG tablet Take 1 tablet (7.5 mg total) by mouth daily. 03/19/24  Yes Ward, Harlene PEDLAR, PA-C  acetaminophen  (TYLENOL ) 500 MG tablet Take 500 mg by mouth every 6 (six) hours as needed for mild pain.    [provider]  benzonatate  (TESSALON ) 200 MG capsule Take 1 capsule (200 mg total) by mouth 3 (three) times daily as needed for cough. Patient not taking: Reported on 11/18/2022 10/28/22   Darral Longs, MD  norelgestromin -ethinyl estradiol  (XULANE) 150-35 MCG/24HR transdermal patch Place 1 patch onto the skin once a week. 10/15/22   Eldonna Suzen Octave, MD  oseltamivir  (TAMIFLU ) 75 MG capsule Take 1 capsule (75 mg total) by mouth every 12 (twelve) hours. Patient not taking: Reported on 11/18/2022 10/28/22   Darral Longs, MD    Family History Family History  Problem Relation Age of Onset   Healthy Mother     Social History Social History   Tobacco Use   Smoking status: Never   Smokeless tobacco: Never  Vaping Use   Vaping status: Never Used  Substance Use Topics   Alcohol use: No   Drug use: No     Allergies   Patient has no known allergies.   Review of Systems Review of Systems  Constitutional:  Negative for chills and fever.  HENT:  Negative for ear pain and sore throat.   Eyes:  Negative for pain and visual disturbance.  Respiratory:  Negative for cough and shortness of breath.   Cardiovascular:  Negative for chest pain and palpitations.  Gastrointestinal:  Negative for abdominal pain and vomiting.  Genitourinary:  Negative for dysuria and hematuria.  Musculoskeletal:  Positive for arthralgias (knee pain). Negative for back pain.  Skin:  Negative for color change and rash.  Neurological:  Negative for seizures and syncope.  All other systems reviewed and are negative.    Physical Exam Triage Vital Signs ED Triage Vitals  Encounter Vitals Group     BP 03/19/24 1307 114/78     Girls Systolic BP Percentile --      Girls Diastolic BP Percentile --      Boys Systolic BP Percentile --      Boys Diastolic BP Percentile --      Pulse Rate 03/19/24 1307 66     Resp 03/19/24 1307 17     Temp 03/19/24 1307 98.3 F (36.8 C)     Temp Source 03/19/24 1307 Oral      SpO2 03/19/24 1307 98 %     Weight 03/19/24 1304 197 lb (89.4 kg)     Height 03/19/24 1304 5' (1.524 m)     Head Circumference --      Peak Flow --      Pain Score 03/19/24 1303 8     Pain Loc --      Pain Education --      Exclude from Growth Chart --    No data found.  Updated Vital Signs BP 114/78 (BP Location: Left Arm)   Pulse 66   Temp 98.3 F (36.8 C) (Oral)   Resp 17   Ht 5' (1.524 m)   Wt 197 lb (89.4 kg)   LMP 03/14/2024   SpO2 98%   BMI 38.47 kg/m   Visual Acuity Right Eye Distance:   Left Eye Distance:   Bilateral Distance:    Right Eye Near:   Left Eye Near:    Bilateral Near:     Physical Exam  Musculoskeletal:     Right knee: No swelling or erythema. Normal range of motion. Tenderness present over the medial joint line.     Right lower leg: Normal.      UC Treatments / Results  Labs (all labs ordered are listed, but only abnormal results are displayed) Labs Reviewed - No data to display  EKG   Radiology DG Knee 2 Views Right Result Date: 03/19/2024 CLINICAL DATA:  pain, injury EXAM: RIGHT KNEE - 1-2 VIEW COMPARISON:  June 23, 2022 FINDINGS: No acute fracture or dislocation. Small joint effusion. There is no evidence of arthropathy or other focal bone abnormality. Soft tissues are unremarkable. IMPRESSION: Small joint effusion.  No acute fracture or dislocation. Electronically Signed   By: Rogelia Myers M.D.   On: 03/19/2024 13:56    Procedures Procedures (including critical care time)  Medications Ordered in UC Medications  ketorolac (TORADOL) 30 MG/ML injection 30 mg (has no administration in time range)    Initial Impression / Assessment and Plan / UC Course  I have reviewed the triage vital signs and the nursing notes.  Pertinent labs & imaging results that were available during my care of the patient were reviewed by me and considered in my medical decision making (see chart for details).     Right knee pain.  Imaging  negative in clinic today.  Toradol given, knee brace given.  Will prescribe Mobic to take for pain.  Recommend ice, rest.  If no improvement recommendation is to follow-up with orthopedics. Final Clinical Impressions(s) / UC Diagnoses   Final diagnoses:  Acute pain of right knee     Discharge Instructions      Las radiografas de hoy son normales. Se recomienda aplicar hielo y una rodillera en la clnica hoy para mayor comodidad. Se puede tomar Mobic una vez al da para aliviar la inflamacin. Se puede tomar un relajante muscular, Flexeril, segn sea necesario. Si no hay mejora, se recomienda seguimiento con Publishing rights manager.  X-rays are normal today. Recommend ice and knee sleeve given in clinic today for comfort. Can take Mobic once daily to help with inflammation. Can take muscle relaxer as needed. If no improvement recommend follow-up with orthopedics.     ED Prescriptions     Medication Sig Dispense Auth. Provider   meloxicam (MOBIC) 7.5 MG tablet Take 1 tablet (7.5 mg total) by mouth daily. 30 tablet Ward, Zaccary Creech Z, PA-C   cyclobenzaprine (FLEXERIL) 10 MG tablet Take 1 tablet (10 mg total) by mouth 2 (two) times daily as needed for muscle spasms. 20 tablet Ward, Joie Hipps Z, PA-C      PDMP not reviewed this encounter.   Ward, Harlene PEDLAR, PA-C 03/19/24 1400

## 2024-03-19 NOTE — Discharge Instructions (Addendum)
 Las radiografas de hoy son normales. Se recomienda aplicar hielo y una rodillera en la clnica hoy para mayor comodidad. Se puede tomar Mobic una vez al da para aliviar la inflamacin. Se puede tomar un relajante muscular, Flexeril, segn sea necesario. Si no hay mejora, se recomienda seguimiento con Publishing rights manager.  X-rays are normal today. Recommend ice and knee sleeve given in clinic today for comfort. Can take Mobic once daily to help with inflammation. Can take muscle relaxer as needed. If no improvement recommend follow-up with orthopedics.

## 2024-07-28 ENCOUNTER — Ambulatory Visit (HOSPITAL_COMMUNITY): Payer: Self-pay

## 2024-09-07 ENCOUNTER — Encounter (HOSPITAL_COMMUNITY): Payer: Self-pay

## 2024-09-07 ENCOUNTER — Inpatient Hospital Stay (HOSPITAL_COMMUNITY): Admission: RE | Admit: 2024-09-07 | Discharge: 2024-09-07 | Payer: Self-pay | Attending: Family Medicine

## 2024-09-07 VITALS — BP 132/82 | HR 76 | Temp 97.4°F | Resp 17

## 2024-09-07 DIAGNOSIS — G8929 Other chronic pain: Secondary | ICD-10-CM

## 2024-09-07 DIAGNOSIS — M25561 Pain in right knee: Secondary | ICD-10-CM

## 2024-09-07 MED ORDER — DEXAMETHASONE SOD PHOSPHATE PF 10 MG/ML IJ SOLN
10.0000 mg | Freq: Once | INTRAMUSCULAR | Status: AC
  Administered 2024-09-07: 19:00:00 10 mg via INTRAMUSCULAR

## 2024-09-07 NOTE — Discharge Instructions (Signed)
 Meds ordered this encounter  Medications   dexamethasone (DECADRON) injection 10 mg

## 2024-09-07 NOTE — ED Triage Notes (Addendum)
 Pt is coming in today for right knee pain that started in June when twisting foot due to carrying a heavy object . Pt was previously seen for the same,and given a brace to wear, but hasn't worn the brace. Haws been taking tylenol , but with no relief.  Sales Executive # [phone]

## 2024-09-08 NOTE — ED Provider Notes (Signed)
 [facility]   [id number] 09/07/24 Arrival Time: 1749  ASSESSMENT & PLAN:  1. Chronic pain of right knee   X-ray reviewed from 03/19/25. No bony abnormalities.  Given chronic pain that is not improving, referral placed to orthopaedist.  Meds ordered this encounter  Medications   dexamethasone  (DECADRON ) injection 10 mg   Orders Placed This Encounter  Procedures   Ambulatory referral to Orthopedic Surgery   Recommend:  Follow-up Information     Call  Sherida Adine BROCKS, MD.   Specialty: Orthopedic Surgery Contact information: [address] [place] [address] [phone]                 Reviewed expectations re: course of current medical issues. Questions answered. Outlined signs and symptoms indicating need for more acute intervention. Patient verbalized understanding. After Visit Summary given.  SUBJECTIVE: History from: patient. Spanish video interpreter used. Katherine Paul is a 38 y.o. female who reports R knee pain; x 5-6 months; seen here 02/2025; note and imaging reviewed. Denies direct knee trauma. Pain started after she twisted her foot while carrying heavy object. Tylenol  without relief. Has tried knee brace without any help. Denies extremity sensation changes or weakness.   Past Surgical History:  Procedure Laterality Date   CHOLECYSTECTOMY, LAPAROSCOPIC  01/31/08      OBJECTIVE:  Vitals:   09/07/24 1835  BP: 132/82  Pulse: 76  Resp: 17  Temp: (!) 97.4 F (36.3 C)  TempSrc: Oral  SpO2: 98%    General appearance: alert; no distress HEENT: [place]; AT Neck: supple with FROM Resp: unlabored respirations Extremities: RLE: warm with well perfused appearance; without specific bony TTP; pain feels deep inside; FROM; without swelling CV: brisk extremity capillary refill of RLE; 2+ DP pulse of RLE. Skin: warm and dry; no visible rashes Neurologic: gait normal; normal sensation and strength of RLE Psychological: alert and  cooperative; normal mood and affect  Imaging: No results found.    Allergies[1]  History reviewed. No pertinent past medical history. Social History   Socioeconomic History   Marital status: Single    Spouse name: Not on file   Number of children: Not on file   Years of education: Not on file   Highest education level: Not on file  Occupational History   Not on file  Tobacco Use   Smoking status: Never   Smokeless tobacco: Never  Vaping Use   Vaping status: Never Used  Substance and Sexual Activity   Alcohol use: No   Drug use: No   Sexual activity: Yes    Birth control/protection: None  Other Topics Concern   Not on file  Social History Narrative   Not on file   Social Drivers of Health   Tobacco Use: Low Risk (09/07/2024)   Patient History    Smoking Tobacco Use: Never    Smokeless Tobacco Use: Never    Passive Exposure: Not on file  Financial Resource Strain: Not on file  Food Insecurity: Not on file  Transportation Needs: Not on file  Physical Activity: Not on file  Stress: Not on file  Social Connections: Not on file  Depression (PHQ2-9): Medium Risk (11/18/2022)   Depression (PHQ2-9)    PHQ-2 Score: 7  Alcohol Screen: Not on file  Housing: Not on file  Utilities: Not on file  Health Literacy: Not on file   Family History  Problem Relation Age of Onset   Healthy Mother    Past Surgical History:  Procedure Laterality Date  CHOLECYSTECTOMY, LAPAROSCOPIC  01/31/08        [1] No Known Allergies    Rolinda Rogue, MD 09/08/24 [phone]
